# Patient Record
Sex: Female | Born: 1970
Health system: Southern US, Community
[De-identification: ages and names within clinical notes are randomized; demographics above are authoritative.]

## PROBLEM LIST (undated history)

## (undated) DIAGNOSIS — F419 Anxiety disorder, unspecified: Secondary | ICD-10-CM

## (undated) DIAGNOSIS — E079 Disorder of thyroid, unspecified: Secondary | ICD-10-CM

## (undated) DIAGNOSIS — T7840XA Allergy, unspecified, initial encounter: Secondary | ICD-10-CM

## (undated) DIAGNOSIS — R011 Cardiac murmur, unspecified: Secondary | ICD-10-CM

## (undated) DIAGNOSIS — I10 Essential (primary) hypertension: Secondary | ICD-10-CM

## (undated) DIAGNOSIS — F32A Depression, unspecified: Secondary | ICD-10-CM

## (undated) DIAGNOSIS — I471 Supraventricular tachycardia, unspecified: Secondary | ICD-10-CM

## (undated) DIAGNOSIS — M1990 Unspecified osteoarthritis, unspecified site: Secondary | ICD-10-CM

## (undated) HISTORY — DX: Unspecified osteoarthritis, unspecified site: M19.90

## (undated) HISTORY — DX: Disorder of thyroid, unspecified: E07.9

## (undated) HISTORY — DX: Allergy, unspecified, initial encounter: T78.40XA

## (undated) HISTORY — DX: Depression, unspecified: F32.A

## (undated) HISTORY — DX: Cardiac murmur, unspecified: R01.1

## (undated) HISTORY — DX: Anxiety disorder, unspecified: F41.9

## (undated) HISTORY — PX: CERVICAL DISCECTOMY: SHX98

## (undated) HISTORY — PX: OTHER SURGICAL HISTORY: SHX169

---

## 2003-06-13 ENCOUNTER — Other Ambulatory Visit: Admission: RE | Admit: 2003-06-13 | Discharge: 2003-06-13 | Payer: Self-pay | Admitting: Obstetrics and Gynecology

## 2003-11-14 ENCOUNTER — Emergency Department (HOSPITAL_COMMUNITY): Admission: EM | Admit: 2003-11-14 | Discharge: 2003-11-14 | Payer: Self-pay | Admitting: Family Medicine

## 2006-03-03 ENCOUNTER — Emergency Department (HOSPITAL_COMMUNITY): Admission: EM | Admit: 2006-03-03 | Discharge: 2006-03-03 | Payer: Self-pay | Admitting: Family Medicine

## 2008-08-09 ENCOUNTER — Emergency Department (HOSPITAL_COMMUNITY): Admission: EM | Admit: 2008-08-09 | Discharge: 2008-08-09 | Payer: Self-pay | Admitting: Family Medicine

## 2009-05-22 ENCOUNTER — Ambulatory Visit: Payer: Self-pay | Admitting: Family Medicine

## 2009-05-26 ENCOUNTER — Encounter: Admission: RE | Admit: 2009-05-26 | Discharge: 2009-05-26 | Payer: Self-pay | Admitting: Family Medicine

## 2009-08-10 ENCOUNTER — Encounter: Admission: RE | Admit: 2009-08-10 | Discharge: 2009-08-10 | Payer: Self-pay | Admitting: Neurosurgery

## 2009-08-20 ENCOUNTER — Encounter (HOSPITAL_COMMUNITY): Admission: RE | Admit: 2009-08-20 | Discharge: 2009-10-16 | Payer: Self-pay | Admitting: Neurosurgery

## 2012-03-07 ENCOUNTER — Encounter: Payer: Self-pay | Admitting: Family Medicine

## 2013-07-07 ENCOUNTER — Emergency Department (HOSPITAL_COMMUNITY)
Admission: EM | Admit: 2013-07-07 | Discharge: 2013-07-07 | Disposition: A | Discharge status: Home / Self Care | Payer: Managed Care, Other (non HMO) | Attending: Emergency Medicine | Admitting: Emergency Medicine

## 2013-07-07 ENCOUNTER — Emergency Department (HOSPITAL_COMMUNITY)
Admission: EM | Admit: 2013-07-07 | Discharge: 2013-07-07 | Disposition: A | Discharge status: Nursing Home (Includes ICF) | Payer: Managed Care, Other (non HMO) | Source: Home / Self Care | Attending: Family Medicine | Admitting: Family Medicine

## 2013-07-07 ENCOUNTER — Encounter (HOSPITAL_COMMUNITY): Payer: Self-pay | Admitting: Emergency Medicine

## 2013-07-07 ENCOUNTER — Emergency Department (HOSPITAL_COMMUNITY): Payer: Managed Care, Other (non HMO)

## 2013-07-07 DIAGNOSIS — M25559 Pain in unspecified hip: Secondary | ICD-10-CM | POA: Insufficient documentation

## 2013-07-07 DIAGNOSIS — M549 Dorsalgia, unspecified: Secondary | ICD-10-CM

## 2013-07-07 DIAGNOSIS — Z91040 Latex allergy status: Secondary | ICD-10-CM | POA: Insufficient documentation

## 2013-07-07 DIAGNOSIS — M25551 Pain in right hip: Secondary | ICD-10-CM | POA: Diagnosis present

## 2013-07-07 DIAGNOSIS — G8929 Other chronic pain: Secondary | ICD-10-CM | POA: Insufficient documentation

## 2013-07-07 LAB — POCT URINALYSIS DIP (DEVICE)
BILIRUBIN URINE: NEGATIVE
GLUCOSE, UA: NEGATIVE mg/dL
Hgb urine dipstick: NEGATIVE
Ketones, ur: NEGATIVE mg/dL
Leukocytes, UA: NEGATIVE
Nitrite: NEGATIVE
PROTEIN: NEGATIVE mg/dL
Specific Gravity, Urine: >=1.03 (ref 1.005–1.030)
UROBILINOGEN UA: 0.2 mg/dL (ref 0.0–1.0)
pH: 6 (ref 5.0–8.0)

## 2013-07-07 MED ORDER — OXYCODONE-ACETAMINOPHEN 5-325 MG PO TABS: 0.5000 | ORAL_TABLET | ORAL | Status: DC | PRN

## 2013-07-07 MED ORDER — CYCLOBENZAPRINE HCL 10 MG PO TABS: 5.0000 mg | ORAL_TABLET | Freq: Two times a day (BID) | ORAL | Status: DC | PRN

## 2013-07-07 MED ORDER — IBUPROFEN 800 MG PO TABS
800.0000 mg | ORAL_TABLET | Freq: Once | ORAL | Status: AC
  Administered 2013-07-07: 800 mg via ORAL
  Filled 2013-07-07: qty 1

## 2013-07-07 NOTE — ED Provider Notes (Signed)
CSN: [id number]     Arrival date & time 07/07/13  1811 History   First MD Initiated Contact with Patient 07/07/13 1856     Chief Complaint  Patient presents with  . Back Pain    Right side     (Consider location/radiation/quality/duration/timing/severity/associated sxs/prior Treatment) Patient is a 43 y.o. female presenting with back pain. The history is provided by the patient.  Back Pain Location:  Lumbar spine Quality:  Aching Radiates to:  Does not radiate Pain severity:  Moderate Pain is:  Same all the time Onset quality:  Gradual Timing:  Intermittent Progression since onset: acute worsening. Chronicity:  Chronic Context comment:  Worse after getting up this morning Relieved by: ibuprofen, rest. Worsened by:  Ambulation Ineffective treatments:  None tried Associated symptoms: no abdominal pain, no chest pain, no dysuria, no fever and no headaches     History reviewed. No pertinent past medical history. Past Surgical History  Procedure Laterality Date  . Cervical discectomy     No family history on file. History  Substance Use Topics  . Smoking status: Never Smoker   . Smokeless tobacco: Not on file  . Alcohol Use: No   OB History   Grav Para Term Preterm Abortions TAB SAB Ect Mult Living                 Review of Systems  Constitutional: Negative for fever and fatigue.  HENT: Negative for congestion and drooling.   Eyes: Negative for pain.  Respiratory: Negative for cough and shortness of breath.   Cardiovascular: Negative for chest pain.  Gastrointestinal: Negative for nausea, vomiting, abdominal pain and diarrhea.  Genitourinary: Negative for dysuria and hematuria.  Musculoskeletal: Positive for back pain. Negative for gait problem and neck pain.  Skin: Negative for color change.  Neurological: Negative for dizziness and headaches.  Hematological: Negative for adenopathy.  Psychiatric/Behavioral: Negative for behavioral problems.  All other systems  reviewed and are negative.      Allergies  Latex  Home Medications   Current Outpatient Rx  Name  Route  Sig  Dispense  Refill  . ibuprofen (ADVIL,MOTRIN) 800 MG tablet   Oral   Take 800 mg by mouth every 8 (eight) hours as needed for mild pain.           BP 126/92  Pulse 65  Temp(Src) 98.2 F (36.8 C) (Oral)  Resp 15  Ht 5\' 5"  (1.651 m)  Wt 225 lb (102.059 kg)  BMI 37.44 kg/m2  SpO2 100%  LMP 06/27/2013 Physical Exam  Nursing note and vitals reviewed. Constitutional: She is oriented to person, place, and time. She appears well-developed and well-nourished.  HENT:  Head: Normocephalic.  Mouth/Throat: Oropharynx is clear and moist. No oropharyngeal exudate.  Eyes: Conjunctivae and EOM are normal. Pupils are equal, round, and reactive to light.  Neck: Normal range of motion. Neck supple.  Cardiovascular: Normal rate, regular rhythm, normal heart sounds and intact distal pulses.  Exam reveals no gallop and no friction rub.   No murmur heard. Pulmonary/Chest: Effort normal and breath sounds normal. No respiratory distress. She has no wheezes.  Abdominal: Soft. Bowel sounds are normal. There is no tenderness. There is no rebound and no guarding.  Musculoskeletal: Normal range of motion. She exhibits no edema and no tenderness.  Mild midline lower lumbar tenderness to palpation.  Right upper gluteal tenderness to palpation.  Normal active and passive range of motion of right hip with mild reproduction of pain.  Back  pain is reproduced with rotation of the torso to the left and right.  2+ distal pulses in bilateral lower extremities.  Neurological: She is alert and oriented to person, place, and time.  Skin: Skin is warm and dry.  Psychiatric: She has a normal mood and affect. Her behavior is normal.    ED Course  Procedures (including critical care time) Labs Review Labs Reviewed - No data to display Imaging Review Dg Lumbar Spine Complete  07/07/2013   EXAM:  LUMBAR SPINE - COMPLETE 4+ VIEW  COMPARISON:  None.  FINDINGS: There is no evidence of lumbar spine fracture. Alignment is normal. Lumbar disc spaces are maintained. Mild to moderate degenerative disc disease seen at T12-L1. No evidence of facet DJD. No lytic or sclerotic bone lesions identified. Transitional lumbosacral vertebra noted at L5.  IMPRESSION: No acute findings.  T12-L1 degenerative disc disease.   Electronically Signed   By: Earle Gell M.D.   On: 07/07/2013 21:42   Dg Hip Complete Right  07/07/2013   CLINICAL DATA:  Chronic right-sided hip pain. Unable to bear weight on the right leg.  EXAM: RIGHT HIP - COMPLETE 2+ VIEW  COMPARISON:  No priors.  FINDINGS: AP view of the pelvis and AP and lateral views of the right hip demonstrate no acute displaced fracture of the bony pelvic ring or of the visualized right proximal femur. Right femoral head is properly located. Numerous pelvic phleboliths are incidentally noted.  IMPRESSION: 1. No acute radiographic abnormality of the bony pelvis or the right hip.   Electronically Signed   By: Vinnie Langton M.D.   On: 07/07/2013 21:42     EKG Interpretation None      MDM   Final diagnoses:  Back pain  Right hip pain    7:59 PM 43 y.o. female who presents with chronic low back pain which she has had since last summer. She denies any specific injury. She notes acute worsening this morning after getting out of bed. She states that her pain is so bad she is having difficulty ambulating. She also notes some mild right hip pain which is worse with ambulation. She's had this hip pain for several months. She denies any fevers, IV drug abuse, or other back pain red flags. She has focal tenderness of her midline lower lumbar area and right gluteal area. She is afebrile and vital signs are unremarkable here. Will get screening imaging. I offered narcotic medicines for pain control. She would prefer ibuprofen. Last menstrual period was 06/27/2013.  10:57  PM: I interpreted/reviewed the labs and/or imaging which were non-contributory.  Pt's pain improved w/ ibuprofen. Possible msk cause of her pain. Will provide stronger pain medicine and rec course of nsaids. Pt is ambulatory. I have discussed the diagnosis/risks/treatment options with the patient and believe the pt to be eligible for discharge home to follow-up with her pcp for continued pain. We also discussed returning to the ED immediately if new or worsening sx occur. We discussed the sx which are most concerning (e.g., worsening pain, fever, bp red flags discussed) that necessitate immediate return. Medications administered to the patient during their visit and any new prescriptions provided to the patient are listed below.  Medications given during this visit Medications  ibuprofen (ADVIL,MOTRIN) tablet 800 mg (800 mg Oral Given 07/07/13 2018)    Discharge Medication List as of 07/07/2013 10:59 PM    START taking these medications   Details  cyclobenzaprine (FLEXERIL) 10 MG tablet Take 0.5 tablets (5  mg total) by mouth 2 (two) times daily as needed for muscle spasms., Starting 07/07/2013, Until Discontinued, Print    oxyCODONE-acetaminophen (PERCOCET) 5-325 MG per tablet Take 0.5 tablets by mouth every 4 (four) hours as needed., Starting 07/07/2013, Until Discontinued, Print         Blanchard Kelch, MD 07/09/13 2033

## 2013-07-07 NOTE — ED Notes (Addendum)
Pt sent from urgent care for further eval of right sided low back pain that radiates around to hip area. Pt unsure when pain started. Pt reports fell recently some time while sking

## 2013-07-07 NOTE — ED Notes (Signed)
Pt reports lumbar pain radiating to groin x6 months. States "sometimes a 2/10 and sometimes a 10/10, but always there." Patient described pain as "bones grinding together." Patient reports pain started with last OBGYN exam approx 6 months ago. Diagnosed with fibroid cysts. NAD.

## 2013-07-07 NOTE — ED Provider Notes (Signed)
CSN: [id number]     Arrival date & time 07/07/13  1616 History   First MD Initiated Contact with Patient 07/07/13 1707     Chief Complaint  Patient presents with  . Back Pain   (Consider location/radiation/quality/duration/timing/severity/associated sxs/prior Treatment) Patient is a 43 y.o. female presenting with back pain. The history is provided by the patient and the spouse.  Back Pain Location:  Lumbar spine Quality:  Shooting Radiates to: right groin area. Pain severity:  Moderate (usually level 1-2 , now 9-10, requiring help with ambulation today.) Onset quality:  Gradual Duration:  6 months Progression:  Worsening Chronicity:  Chronic Context comment:  Uncertain etiol, has been seen by her gyn and lmd without relief of sx or dx. Worsened by:  Movement Associated symptoms: abdominal pain and pelvic pain   Associated symptoms: no abdominal swelling, no chest pain, no dysuria, no fever, no paresthesias and no weakness     History reviewed. No pertinent past medical history. History reviewed. No pertinent past surgical history. No family history on file. History  Substance Use Topics  . Smoking status: Never Smoker   . Smokeless tobacco: Not on file  . Alcohol Use: No   OB History   Grav Para Term Preterm Abortions TAB SAB Ect Mult Living                 Review of Systems  Constitutional: Negative.  Negative for fever.  Cardiovascular: Negative for chest pain.  Gastrointestinal: Positive for abdominal pain. Negative for nausea, vomiting and diarrhea.  Genitourinary: Positive for pelvic pain. Negative for dysuria, urgency and menstrual problem.  Musculoskeletal: Positive for back pain. Negative for joint swelling.  Skin: Negative.   Neurological: Negative for weakness and paresthesias.    Allergies  Latex  Home Medications   Current Outpatient Rx  Name  Route  Sig  Dispense  Refill  . ibuprofen (ADVIL,MOTRIN) 800 MG tablet   Oral   Take 800 mg by mouth every  8 (eight) hours as needed.          BP 139/95  Pulse 80  Temp(Src) 99.7 F (37.6 C) (Oral)  Resp 18  SpO2 98%  LMP 06/27/2013 Physical Exam  Nursing note and vitals reviewed. Constitutional: She is oriented to person, place, and time. She appears well-developed and well-nourished. She appears distressed.  Neck: Normal range of motion. Neck supple.  Pulmonary/Chest: Effort normal and breath sounds normal.  Abdominal: Soft. Bowel sounds are normal. She exhibits no distension and no mass. There is no splenomegaly or hepatomegaly. There is tenderness in the epigastric area. There is no rigidity, no rebound, no guarding and no CVA tenderness. No hernia. Hernia confirmed negative in the ventral area.  Musculoskeletal: She exhibits tenderness.       Back:  Neurological: She is alert and oriented to person, place, and time.  Skin: Skin is warm and dry.    ED Course  Procedures (including critical care time) Labs Review Labs Reviewed  POCT URINALYSIS DIP (DEVICE)   Imaging Review No results found. U/a neg.  MDM   1. Back pain with radiation    Sent for eval of likely lumbar disc pain, worsening today affecting ability to walk., present for 6 mos, getting worse.    Billy Fischer, MD 07/07/13 [id number]

## 2013-07-07 NOTE — ED Notes (Signed)
MD at bedside. 

## 2013-07-07 NOTE — ED Notes (Signed)
Patient complains of lower back pain; states pain has steadily gotten worse over the past 3 months to the point of having problems ambulating and moving.

## 2016-05-03 ENCOUNTER — Other Ambulatory Visit: Payer: Self-pay | Admitting: Family Medicine

## 2016-05-03 DIAGNOSIS — Z1231 Encounter for screening mammogram for malignant neoplasm of breast: Secondary | ICD-10-CM

## 2016-05-10 ENCOUNTER — Other Ambulatory Visit: Payer: Self-pay | Admitting: *Deleted

## 2016-05-10 ENCOUNTER — Inpatient Hospital Stay
Admission: RE | Admit: 2016-05-10 | Discharge: 2016-05-10 | Disposition: A | Discharge status: Home / Self Care | Payer: Self-pay | Source: Ambulatory Visit | Attending: *Deleted | Admitting: *Deleted

## 2016-05-10 DIAGNOSIS — Z9289 Personal history of other medical treatment: Secondary | ICD-10-CM

## 2016-06-29 ENCOUNTER — Ambulatory Visit (HOSPITAL_COMMUNITY)
Admission: EM | Admit: 2016-06-29 | Discharge: 2016-06-29 | Disposition: A | Discharge status: Nursing Home (Includes ICF) | Payer: Managed Care, Other (non HMO)

## 2016-06-29 ENCOUNTER — Emergency Department (HOSPITAL_COMMUNITY): Payer: BLUE CROSS/BLUE SHIELD

## 2016-06-29 ENCOUNTER — Emergency Department (HOSPITAL_COMMUNITY)
Admission: EM | Admit: 2016-06-29 | Discharge: 2016-06-29 | Disposition: A | Discharge status: Home / Self Care | Payer: Managed Care, Other (non HMO)

## 2016-06-29 ENCOUNTER — Encounter (HOSPITAL_COMMUNITY): Payer: Self-pay | Admitting: *Deleted

## 2016-06-29 ENCOUNTER — Emergency Department (HOSPITAL_COMMUNITY)
Admission: EM | Admit: 2016-06-29 | Discharge: 2016-06-29 | Disposition: A | Discharge status: Home / Self Care | Payer: BLUE CROSS/BLUE SHIELD | Attending: Emergency Medicine | Admitting: Emergency Medicine

## 2016-06-29 DIAGNOSIS — R002 Palpitations: Secondary | ICD-10-CM | POA: Diagnosis present

## 2016-06-29 DIAGNOSIS — Z91040 Latex allergy status: Secondary | ICD-10-CM | POA: Insufficient documentation

## 2016-06-29 DIAGNOSIS — I471 Supraventricular tachycardia: Secondary | ICD-10-CM | POA: Diagnosis not present

## 2016-06-29 HISTORY — DX: Supraventricular tachycardia, unspecified: I47.10

## 2016-06-29 HISTORY — DX: Supraventricular tachycardia: I47.1

## 2016-06-29 LAB — BASIC METABOLIC PANEL
ANION GAP: 10 (ref 5–15)
BUN: 12 mg/dL (ref 6–20)
CALCIUM: 9.4 mg/dL (ref 8.9–10.3)
CO2: 24 mmol/L (ref 22–32)
Chloride: 104 mmol/L (ref 101–111)
Creatinine, Ser: 0.96 mg/dL (ref 0.44–1.00)
GFR calc Af Amer: >60 mL/min (ref >60)
GLUCOSE: 128 mg/dL — AB (ref 65–99)
Potassium: 4.3 mmol/L (ref 3.5–5.1)
SODIUM: 138 mmol/L (ref 135–145)

## 2016-06-29 LAB — CBC
HCT: 44.5 % (ref 36.0–46.0)
HEMOGLOBIN: 14.9 g/dL (ref 12.0–15.0)
MCH: 30.7 pg (ref 26.0–34.0)
MCHC: 33.5 g/dL (ref 30.0–36.0)
MCV: 91.8 fL (ref 78.0–100.0)
Platelets: 289 10*3/uL (ref 150–400)
RBC: 4.85 MIL/uL (ref 3.87–5.11)
RDW: 13.1 % (ref 11.5–15.5)
WBC: 6.8 10*3/uL (ref 4.0–10.5)

## 2016-06-29 LAB — I-STAT TROPONIN, ED: TROPONIN I, POC: 0.02 ng/mL (ref 0.00–0.08)

## 2016-06-29 MED ORDER — METOPROLOL TARTRATE 50 MG PO TABS: 50.0000 mg | ORAL_TABLET | Freq: Two times a day (BID) | ORAL | 0 refills | Status: DC | PRN

## 2016-06-29 MED ORDER — METOPROLOL TARTRATE 5 MG/5ML IV SOLN
5.0000 mg | Freq: Once | INTRAVENOUS | Status: AC
  Administered 2016-06-29: 5 mg via INTRAVENOUS
  Filled 2016-06-29: qty 5

## 2016-06-29 NOTE — ED Provider Notes (Signed)
[facility] DEPT Provider Note   CSN: [id number] Arrival date & time: 06/29/16  1036     History   Chief Complaint Chief Complaint  Patient presents with  . Palpitations    HPI Elaine Mosley is a 46 y.o. female.  HPI Patient presents to the emergency department complaining of elevated heart rate.  Patient has a history of SVT.  She's not on any medications currently.  No chest pain or shortness of breath but does feel her heart racing.  This began today.  She presents the emergency department with a heart rate in the 170s and SVT.  No other complaints.  No recent illness.  2-3 caffeinated drinks per week but no more recently.  No other complaints   Past Medical History:  Diagnosis Date  . SVT (supraventricular tachycardia) [facility])     Patient Active Problem List   Diagnosis Date Noted  . Back pain 07/07/2013  . Right hip pain 07/07/2013    Past Surgical History:  Procedure Laterality Date  . CERVICAL DISCECTOMY      OB History    No data available       Home Medications    Prior to Admission medications   Medication Sig Start Date End Date Taking? Authorizing Provider  cyclobenzaprine (FLEXERIL) 10 MG tablet Take 0.5 tablets (5 mg total) by mouth 2 (two) times daily as needed for muscle spasms. 07/07/13   Pamella Pert, MD  ibuprofen (ADVIL,MOTRIN) 800 MG tablet Take 800 mg by mouth every 8 (eight) hours as needed for mild pain.     Historical Provider, MD  oxyCODONE-acetaminophen (PERCOCET) 5-325 MG per tablet Take 0.5 tablets by mouth every 4 (four) hours as needed. 07/07/13   Pamella Pert, MD    Family History No family history on file.  Social History Social History  Substance Use Topics  . Smoking status: Never Smoker  . Smokeless tobacco: Not on file  . Alcohol use No     Allergies   Latex   Review of Systems Review of Systems  All other systems reviewed and are negative.    Physical Exam Updated Vital Signs BP 139/58 (BP  Location: Left Arm)   Pulse (!) 176   Temp 97.7 F (36.5 C) (Oral)   Resp 20   LMP 06/22/2016   SpO2 98%   Physical Exam  Constitutional: She is oriented to person, place, and time. She appears well-developed and well-nourished. No distress.  HENT:  Head: Normocephalic and atraumatic.  Eyes: EOM are normal.  Neck: Normal range of motion.  Cardiovascular: Regular rhythm and normal heart sounds.   Tachycardia  Pulmonary/Chest: Effort normal and breath sounds normal.  Abdominal: Soft. She exhibits no distension. There is no tenderness.  Musculoskeletal: Normal range of motion.  Neurological: She is alert and oriented to person, place, and time.  Skin: Skin is warm and dry.  Psychiatric: She has a normal mood and affect. Judgment normal.  Nursing note and vitals reviewed.    ED Treatments / Results  Labs (all labs ordered are listed, but only abnormal results are displayed) Labs Reviewed  BASIC METABOLIC PANEL - Abnormal; Notable for the following:       Result Value   Glucose, Bld 128 (*)    All other components within normal limits  CBC  I-STAT TROPOININ, ED    EKG  EKG Interpretation  Date/Time:  Wednesday June 29 2016 10:40:26 EDT Ventricular Rate:  176 PR Interval:    QRS Duration: 64 QT Interval:  264 QTC Calculation: 451 R Axis:   54 Text Interpretation:  Supraventricular tachycardia Nonspecific ST and T wave abnormality Abnormal ECG No old tracing to compare Confirmed by Anniemae Haberkorn  MD, Lennette Bihari ([id number]) on 06/29/2016 12:28:33 PM       Radiology Dg Chest Portable 1 View  Result Date: 06/29/2016 CLINICAL DATA:  Cardiac palpitations EXAM: PORTABLE CHEST 1 VIEW COMPARISON:  None. FINDINGS: There is no edema or consolidation. Heart size and pulmonary vascularity are normal. No adenopathy. No bone lesions. IMPRESSION: No edema or consolidation. Electronically Signed   By: Lowella Grip III M.D.   On: 06/29/2016 11:21    Procedures Procedures (including critical  care time)  Medications Ordered in ED Medications  metoprolol (LOPRESSOR) injection 5 mg (5 mg Intravenous Given 06/29/16 1145)     Initial Impression / Assessment and Plan / ED Course  I have reviewed the triage vital signs and the nursing notes.  Pertinent labs & imaging results that were available during my care of the patient were reviewed by me and considered in my medical decision making (see chart for details).    After 5 mg IV Lopressor the patient converted to sinus rhythm.  Current heart rate is 93.  She'll be placed on when necessary metoprolol.  She's had a echocardiogram previously.  She likely does not need additional cardiac workup at unless this becomes  more recurrent.  Her last event was over a year ago  Final Clinical Impressions(s) / ED Diagnoses   Final diagnoses:  SVT (supraventricular tachycardia) ([place])    New Prescriptions New Prescriptions   No medications on file     Jola Schmidt, MD 06/29/16 1229

## 2016-06-29 NOTE — ED Notes (Signed)
Walked patient to the bathroom patient did great  

## 2016-06-29 NOTE — ED Notes (Signed)
Pt chose not to stay in ED and told registration she was leaving.

## 2016-06-29 NOTE — ED Triage Notes (Signed)
Pt reports onset this am of palpitations and felt like heart was racing. Hx of svt. HR 176 at triage.

## 2016-08-09 ENCOUNTER — Other Ambulatory Visit: Payer: Self-pay | Admitting: Obstetrics & Gynecology

## 2016-08-09 ENCOUNTER — Other Ambulatory Visit (HOSPITAL_COMMUNITY)
Admission: RE | Admit: 2016-08-09 | Discharge: 2016-08-09 | Disposition: A | Discharge status: Home / Self Care | Payer: BLUE CROSS/BLUE SHIELD | Source: Ambulatory Visit | Attending: Obstetrics & Gynecology | Admitting: Obstetrics & Gynecology

## 2016-08-09 DIAGNOSIS — Z01411 Encounter for gynecological examination (general) (routine) with abnormal findings: Secondary | ICD-10-CM | POA: Insufficient documentation

## 2016-08-09 DIAGNOSIS — Z1151 Encounter for screening for human papillomavirus (HPV): Secondary | ICD-10-CM | POA: Diagnosis not present

## 2016-08-10 ENCOUNTER — Other Ambulatory Visit: Payer: Self-pay | Admitting: Obstetrics & Gynecology

## 2016-08-10 DIAGNOSIS — E049 Nontoxic goiter, unspecified: Secondary | ICD-10-CM

## 2016-08-15 LAB — CYTOLOGY - PAP
Diagnosis: NEGATIVE
HPV: NOT DETECTED

## 2016-08-18 ENCOUNTER — Ambulatory Visit
Admission: RE | Admit: 2016-08-18 | Discharge: 2016-08-18 | Disposition: A | Discharge status: Home / Self Care | Payer: BLUE CROSS/BLUE SHIELD | Source: Ambulatory Visit | Attending: Obstetrics & Gynecology | Admitting: Obstetrics & Gynecology

## 2016-08-18 DIAGNOSIS — E049 Nontoxic goiter, unspecified: Secondary | ICD-10-CM

## 2017-06-18 ENCOUNTER — Other Ambulatory Visit: Payer: Self-pay

## 2017-06-18 ENCOUNTER — Encounter (HOSPITAL_COMMUNITY): Payer: Self-pay

## 2017-06-18 ENCOUNTER — Emergency Department (HOSPITAL_COMMUNITY)
Admission: EM | Admit: 2017-06-18 | Discharge: 2017-06-18 | Disposition: A | Discharge status: Home / Self Care | Payer: Managed Care, Other (non HMO) | Attending: Emergency Medicine | Admitting: Emergency Medicine

## 2017-06-18 DIAGNOSIS — Z79899 Other long term (current) drug therapy: Secondary | ICD-10-CM | POA: Insufficient documentation

## 2017-06-18 DIAGNOSIS — J029 Acute pharyngitis, unspecified: Secondary | ICD-10-CM | POA: Diagnosis not present

## 2017-06-18 DIAGNOSIS — R040 Epistaxis: Secondary | ICD-10-CM | POA: Diagnosis not present

## 2017-06-18 DIAGNOSIS — Z91040 Latex allergy status: Secondary | ICD-10-CM | POA: Diagnosis not present

## 2017-06-18 LAB — RAPID STREP SCREEN (MED CTR MEBANE ONLY): STREPTOCOCCUS, GROUP A SCREEN (DIRECT): NEGATIVE

## 2017-06-18 MED ORDER — HYDROCODONE-ACETAMINOPHEN 7.5-325 MG/15ML PO SOLN: 15.0000 mL | Freq: Three times a day (TID) | ORAL | 0 refills | Status: DC | PRN

## 2017-06-18 MED ORDER — OXYMETAZOLINE HCL 0.05 % NA SOLN
1.0000 | Freq: Once | NASAL | Status: DC
  Filled 2017-06-18: qty 15

## 2017-06-18 NOTE — ED Provider Notes (Signed)
[facility] EMERGENCY DEPARTMENT Provider Note   CSN: [id number] Arrival date & time: 06/18/17  4098     History   Chief Complaint No chief complaint on file.   HPI Elaine Mosley is a 47 y.o. female.  The history is provided by the patient and medical records.    47 y.o. F with hx of SVT, presenting to the ED for nosebleed.  States this began around 0445 this morning.  Bleeding only from left nostril.  This all began while she was coughing (reports she has the flu).  Not currently on anti-coagulation. No hx of bleeding disorder.  She has been applying pressure and ice without cessation of bleeding.  States he also feels like there is something "stuck" in her throat.  States it feels very swollen and sore, especially with swallowing.  Is able to eat/drink but with some pain.  Past Medical History:  Diagnosis Date  . SVT (supraventricular tachycardia) [facility])     Patient Active Problem List   Diagnosis Date Noted  . Back pain 07/07/2013  . Right hip pain 07/07/2013    Past Surgical History:  Procedure Laterality Date  . CERVICAL DISCECTOMY      OB History    No data available       Home Medications    Prior to Admission medications   Medication Sig Start Date End Date Taking? Authorizing Provider  cyclobenzaprine (FLEXERIL) 10 MG tablet Take 0.5 tablets (5 mg total) by mouth 2 (two) times daily as needed for muscle spasms. 07/07/13   Pamella Pert, MD  ibuprofen (ADVIL,MOTRIN) 800 MG tablet Take 800 mg by mouth every 8 (eight) hours as needed for mild pain.     [provider]  metoprolol (LOPRESSOR) 50 MG tablet Take 1 tablet (50 mg total) by mouth every 12 (twelve) hours as needed (rapid heart rate). 06/29/16   Jola Schmidt, MD  oxyCODONE-acetaminophen (PERCOCET) 5-325 MG per tablet Take 0.5 tablets by mouth every 4 (four) hours as needed. 07/07/13   Pamella Pert, MD    Family History History reviewed. No pertinent family  history.  Social History Social History   Tobacco Use  . Smoking status: Never Smoker  Substance Use Topics  . Alcohol use: No  . Drug use: No     Allergies   Latex   Review of Systems Review of Systems  HENT: Positive for nosebleeds and sore throat.   All other systems reviewed and are negative.    Physical Exam Updated Vital Signs BP (!) 153/116   Pulse 89   Ht 5\' 5"  (1.651 m)   Wt 105.2 kg (232 lb)   SpO2 95%   BMI 38.61 kg/m   Physical Exam  Constitutional: She is oriented to person, place, and time. She appears well-developed and well-nourished.  HENT:  Head: Normocephalic and atraumatic.  Mouth/Throat: Oropharynx is clear and moist.  Small area of bleeding from left septal wall; no septal deformity or hematoma; no external signs of trauma to the face Tonsils 1+ bilaterally without exudate; uvula midline without evidence of peritonsillar abscess; handling secretions appropriately; no difficulty swallowing or speaking; normal phonation without stridor; no blood in posterior oropharynx  Eyes: Conjunctivae and EOM are normal. Pupils are equal, round, and reactive to light.  Neck: Normal range of motion.  Cardiovascular: Normal rate, regular rhythm and normal heart sounds.  Pulmonary/Chest: Effort normal and breath sounds normal. No stridor. No respiratory distress.  Abdominal: Soft. Bowel sounds are normal. There is  no tenderness. There is no rebound.  Musculoskeletal: Normal range of motion.  Neurological: She is alert and oriented to person, place, and time.  Skin: Skin is warm and dry.  Psychiatric: She has a normal mood and affect.  Nursing note and vitals reviewed.    ED Treatments / Results  Labs (all labs ordered are listed, but only abnormal results are displayed) Labs Reviewed  RAPID STREP SCREEN (NOT AT [facility])  CULTURE, GROUP A STREP [facility])    EKG  EKG Interpretation None       Radiology No results found.  Procedures Procedures  (including critical care time)  Medications Ordered in ED Medications  oxymetazoline (AFRIN) 0.05 % nasal spray 1 spray (not administered)     Initial Impression / Assessment and Plan / ED Course  I have reviewed the triage vital signs and the nursing notes.  Pertinent labs & imaging results that were available during my care of the patient were reviewed by me and considered in my medical decision making (see chart for details).  47 year old female here with nosebleed from left nostril that started approximately 1 hour prior to arrival.  On arrival she has some slight bleeding from the left nostril, appears to be from along the septal wall.  There is no septal deformity or hematoma noted.  No external signs of trauma.  Ice and direct pressure were applied and bleeding slowly stopped.  Patient also complaining of sore throat, recent diagnosis of flu.  Does have some mild tonsillar edema on exam but no exudates.  She is handling secretions well.  Rapid strep will be sent.  Will monitor for any recurrent bleeding.  6:09 AM Patient remains without any recurrent bleeding here.  Rapid strep is negative, culture pending.  Suspect this is sequela of flulike illness.  We will plan to discharge home with symptomatic care.  She was given Afrin from ED and instructed on home use, specifically not to use for more than 3 consecutive days.  She has prescription for Flonase from her primary care doctor that she can fill as well.  She was advised she will be contacted if her strep culture results are positive.  Otherwise rest, oral hydration.  Close PCP follow-up.  Discussed plan with patient, she acknowledged understanding and agreed with plan of care.  Return precautions given for new or worsening symptoms.  Final Clinical Impressions(s) / ED Diagnoses   Final diagnoses:  Nosebleed  Sore throat    ED Discharge Orders        Ordered    HYDROcodone-acetaminophen (HYCET) 7.5-325 mg/15 ml solution  Every 8  hours PRN     06/18/17 0614       Larene Pickett, PA-C 06/18/17 0617    Ward, Delice Bison, DO 06/18/17 [phone]

## 2017-06-18 NOTE — ED Triage Notes (Addendum)
Pt. From home . Pt states that her nose started to bleed an hour ago. Pt. Denies hx of nose bleeds. Pt. Is not on blood thinners. Pt. Also reports sore throat while swallowing.

## 2017-06-18 NOTE — Discharge Instructions (Signed)
Take the prescribed medication as directed for sore throat.  We will call you if throat culture is positive. Would recommend to use the flonase spray from your doctor.  If you need to use Afrin for recurrent bleed: 1.  Blow your nose completely. 2.  2 sprays into the nostril that is bleeding. 3. Hold pressure for about 5-10 mins.  Do not use afrin for more than 3 days in a row. Follow-up with your primary care doctor Return to the ED for new or worsening symptoms.

## 2017-06-20 LAB — CULTURE, GROUP A STREP (THRC)

## 2018-10-18 ENCOUNTER — Other Ambulatory Visit (HOSPITAL_COMMUNITY): Payer: Self-pay | Admitting: Family Medicine

## 2018-10-18 DIAGNOSIS — R229 Localized swelling, mass and lump, unspecified: Secondary | ICD-10-CM

## 2018-11-22 ENCOUNTER — Encounter (HOSPITAL_BASED_OUTPATIENT_CLINIC_OR_DEPARTMENT_OTHER): Payer: Self-pay | Admitting: Emergency Medicine

## 2018-11-22 ENCOUNTER — Other Ambulatory Visit: Payer: Self-pay

## 2018-11-22 ENCOUNTER — Emergency Department (HOSPITAL_BASED_OUTPATIENT_CLINIC_OR_DEPARTMENT_OTHER)
Admission: EM | Admit: 2018-11-22 | Discharge: 2018-11-23 | Disposition: A | Discharge status: Home / Self Care | Payer: Managed Care, Other (non HMO) | Attending: Emergency Medicine | Admitting: Emergency Medicine

## 2018-11-22 DIAGNOSIS — Z79899 Other long term (current) drug therapy: Secondary | ICD-10-CM | POA: Insufficient documentation

## 2018-11-22 DIAGNOSIS — Z91040 Latex allergy status: Secondary | ICD-10-CM | POA: Diagnosis not present

## 2018-11-22 DIAGNOSIS — R002 Palpitations: Secondary | ICD-10-CM | POA: Insufficient documentation

## 2018-11-22 LAB — CBC WITH DIFFERENTIAL/PLATELET
Abs Immature Granulocytes: 0.03 10*3/uL (ref 0.00–0.07)
Basophils Absolute: 0 10*3/uL (ref 0.0–0.1)
Basophils Relative: 1 %
Eosinophils Absolute: 0 10*3/uL (ref 0.0–0.5)
Eosinophils Relative: 0 %
HCT: 45.3 % (ref 36.0–46.0)
Hemoglobin: 14.7 g/dL (ref 12.0–15.0)
Immature Granulocytes: 0 %
Lymphocytes Relative: 36 %
Lymphs Abs: 2.8 10*3/uL (ref 0.7–4.0)
MCH: 29.9 pg (ref 26.0–34.0)
MCHC: 32.5 g/dL (ref 30.0–36.0)
MCV: 92.3 fL (ref 80.0–100.0)
Monocytes Absolute: 0.6 10*3/uL (ref 0.1–1.0)
Monocytes Relative: 7 %
Neutro Abs: 4.2 10*3/uL (ref 1.7–7.7)
Neutrophils Relative %: 56 %
Platelets: 250 10*3/uL (ref 150–400)
RBC: 4.91 MIL/uL (ref 3.87–5.11)
RDW: 12.4 % (ref 11.5–15.5)
WBC: 7.7 10*3/uL (ref 4.0–10.5)
nRBC: 0 % (ref 0.0–0.2)

## 2018-11-22 NOTE — ED Provider Notes (Signed)
[facility] Provider Note: Georgena Spurling, MD, FACEP  CSN: [id number] MRN: [id number] ARRIVAL: 11/22/18 at 2320 ROOM: [place]  Palpitations   HISTORY OF PRESENT ILLNESS  52/77/82 42:35 PM Elaine Mosley is a 48 y.o. female with a history of SVT who had a biopsy of her thyroid for suspicious nodule earlier today.  She states her blood pressure was elevated at that visit, 162/104.  She is here with palpitations that began at 10:43 PM.  She describes them as rapid and a feeling like something in her throat was being pulled down.  She denies associated chest pain, shortness of breath, lightheadedness, diaphoresis or nausea.  She took 50 mg of Lopressor but still does not feel "right".  Her heart rate on arrival was noted to be 98 and normal sinus rhythm.    Past Medical History:  Diagnosis Date  . SVT (supraventricular tachycardia) (HCC)     Past Surgical History:  Procedure Laterality Date  . CERVICAL DISCECTOMY      No family history on file.  Social History   Tobacco Use  . Smoking status: Never Smoker  Substance Use Topics  . Alcohol use: No  . Drug use: No    Prior to Admission medications   Medication Sig Start Date End Date Taking? Authorizing Provider  cyclobenzaprine (FLEXERIL) 10 MG tablet Take 0.5 tablets (5 mg total) by mouth 2 (two) times daily as needed for muscle spasms. 07/07/13   Pamella Pert, MD  HYDROcodone-acetaminophen (HYCET) 7.5-325 mg/15 ml solution Take 15 mLs by mouth every 8 (eight) hours as needed for moderate pain. 06/18/17   Larene Pickett, PA-C  ibuprofen (ADVIL,MOTRIN) 800 MG tablet Take 800 mg by mouth every 8 (eight) hours as needed for mild pain.     [provider]  metoprolol (LOPRESSOR) 50 MG tablet Take 1 tablet (50 mg total) by mouth every 12 (twelve) hours as needed (rapid heart rate). 06/29/16   Jola Schmidt, MD  oxyCODONE-acetaminophen (PERCOCET) 5-325 MG per tablet Take 0.5 tablets by mouth  every 4 (four) hours as needed. 07/07/13   Pamella Pert, MD    Allergies Latex   REVIEW OF SYSTEMS  Negative except as noted here or in the History of Present Illness.   PHYSICAL EXAMINATION  Initial Vital Signs Blood pressure (!) 149/114, pulse 98, temperature 97.9 F (36.6 C), temperature source Oral, resp. rate 18, height 5\' 5"  (1.651 m), weight 99.8 kg, SpO2 99 %.  Examination General: Well-developed, well-nourished female in no acute distress; appearance consistent with age of record HENT: normocephalic; atraumatic Eyes: pupils equal, round and reactive to light; extraocular muscles intact Neck: supple Heart: regular rate and rhythm; no murmur Lungs: clear to auscultation bilaterally Abdomen: soft; nondistended; nontender; bowel sounds present Extremities: No deformity; full range of motion; pulses normal Neurologic: Awake, alert and oriented; motor function intact in all extremities and symmetric; no facial droop Skin: Warm and dry Psychiatric: Normal mood and affect   RESULTS  Summary of this visit's results, reviewed by myself:   EKG Interpretation  Date/Time:  Thursday November 22 2018 23:29:30 EDT Ventricular Rate:  99 PR Interval:    QRS Duration: 78 QT Interval:  348 QTC Calculation: 447 R Axis:   45 Text Interpretation:  Sinus rhythm Probable left atrial enlargement Probable left ventricular hypertrophy Borderline T abnormalities, inferior leads Baseline wander in lead(s) II III aVL aVF V1 V3 V5 Previously SVT Confirmed by Walterine Amodei [phone]) on 11/22/2018 11:34:42 PM  Laboratory Studies: Results for orders placed or performed during the hospital encounter of 11/22/18 (from the past 24 hour(s))  CBC with Differential/Platelet     Status: None   Collection Time: 11/22/18 11:43 PM  Result Value Ref Range   WBC 7.7 4.0 - 10.5 K/uL   RBC 4.91 3.87 - 5.11 MIL/uL   Hemoglobin 14.7 12.0 - 15.0 g/dL   HCT 45.3 36.0 - 46.0 %   MCV 92.3 80.0 - 100.0 fL    MCH 29.9 26.0 - 34.0 pg   MCHC 32.5 30.0 - 36.0 g/dL   RDW 12.4 11.5 - 15.5 %   Platelets 250 150 - 400 K/uL   nRBC 0.0 0.0 - 0.2 %   Neutrophils Relative % 56 %   Neutro Abs 4.2 1.7 - 7.7 K/uL   Lymphocytes Relative 36 %   Lymphs Abs 2.8 0.7 - 4.0 K/uL   Monocytes Relative 7 %   Monocytes Absolute 0.6 0.1 - 1.0 K/uL   Eosinophils Relative 0 %   Eosinophils Absolute 0.0 0.0 - 0.5 K/uL   Basophils Relative 1 %   Basophils Absolute 0.0 0.0 - 0.1 K/uL   Immature Granulocytes 0 %   Abs Immature Granulocytes 0.03 0.00 - 0.07 K/uL  Basic metabolic panel     Status: Abnormal   Collection Time: 11/22/18 11:43 PM  Result Value Ref Range   Sodium 138 135 - 145 mmol/L   Potassium 4.1 3.5 - 5.1 mmol/L   Chloride 100 98 - 111 mmol/L   CO2 25 22 - 32 mmol/L   Glucose, Bld 104 (H) 70 - 99 mg/dL   BUN 13 6 - 20 mg/dL   Creatinine, Ser 1.05 (H) 0.44 - 1.00 mg/dL   Calcium 9.5 8.9 - 10.3 mg/dL   GFR calc non Af Amer >60 >60 mL/min   GFR calc Af Amer >60 >60 mL/min   Anion gap 13 5 - 15   Imaging Studies: No results found.  ED COURSE and MDM  Nursing notes and initial vitals signs, including pulse oximetry, reviewed.  Vitals:   11/22/18 2342 11/23/18 0000 11/23/18 0030 11/23/18 0100  BP: (!) 156/114 (!) 150/102 (!) 156/104 (!) 136/107  Pulse: 94 100 94 88  Resp: 16 13 11 16   Temp:      TempSrc:      SpO2: 100% 93% 93% 93%  Weight:      Height:       1:17 AM Patient has been observed in the ED for 2 hours without arrhythmia seen on monitor.  Patient states she feels better and is ready to go home.  She was advised of her elevated blood pressure but as noted above this is already being followed as an outpatient.  PROCEDURES    ED DIAGNOSES     ICD-10-CM   1. Palpitations  R00.2        Aalani Aikens, Jenny Reichmann, MD 11/23/18 [id number]

## 2018-11-22 NOTE — ED Triage Notes (Signed)
Pt states that she felt palpitations around 2243. Took lopressor 50 aroudn 2247. Still doesn't feel right. Had some SHOB when the episode started. None currently.

## 2018-11-23 LAB — BASIC METABOLIC PANEL
Anion gap: 13 (ref 5–15)
BUN: 13 mg/dL (ref 6–20)
CO2: 25 mmol/L (ref 22–32)
Calcium: 9.5 mg/dL (ref 8.9–10.3)
Chloride: 100 mmol/L (ref 98–111)
Creatinine, Ser: 1.05 mg/dL — ABNORMAL HIGH (ref 0.44–1.00)
GFR calc Af Amer: >60 mL/min (ref >60)
GFR calc non Af Amer: >60 mL/min (ref >60)
Glucose, Bld: 104 mg/dL — ABNORMAL HIGH (ref 70–99)
Potassium: 4.1 mmol/L (ref 3.5–5.1)
Sodium: 138 mmol/L (ref 135–145)

## 2018-11-23 NOTE — ED Notes (Signed)
Pt understood dc material. NAD noted. All questions answered to satisfaction. Pt escorted to check out counter.

## 2018-12-18 HISTORY — PX: OTHER SURGICAL HISTORY: SHX169

## 2019-01-19 ENCOUNTER — Emergency Department (HOSPITAL_BASED_OUTPATIENT_CLINIC_OR_DEPARTMENT_OTHER)
Admission: EM | Admit: 2019-01-19 | Discharge: 2019-01-19 | Disposition: A | Discharge status: Home / Self Care | Payer: Managed Care, Other (non HMO) | Attending: Emergency Medicine | Admitting: Emergency Medicine

## 2019-01-19 ENCOUNTER — Encounter (HOSPITAL_BASED_OUTPATIENT_CLINIC_OR_DEPARTMENT_OTHER): Payer: Self-pay | Admitting: Emergency Medicine

## 2019-01-19 ENCOUNTER — Other Ambulatory Visit: Payer: Self-pay

## 2019-01-19 DIAGNOSIS — R002 Palpitations: Secondary | ICD-10-CM | POA: Diagnosis present

## 2019-01-19 DIAGNOSIS — I471 Supraventricular tachycardia: Secondary | ICD-10-CM | POA: Diagnosis not present

## 2019-01-19 DIAGNOSIS — Z91040 Latex allergy status: Secondary | ICD-10-CM | POA: Insufficient documentation

## 2019-01-19 DIAGNOSIS — I10 Essential (primary) hypertension: Secondary | ICD-10-CM | POA: Insufficient documentation

## 2019-01-19 HISTORY — DX: Essential (primary) hypertension: I10

## 2019-01-19 LAB — COMPREHENSIVE METABOLIC PANEL
ALT: 14 U/L (ref 0–44)
AST: 18 U/L (ref 15–41)
Albumin: 4 g/dL (ref 3.5–5.0)
Alkaline Phosphatase: 68 U/L (ref 38–126)
Anion gap: 12 (ref 5–15)
BUN: 13 mg/dL (ref 6–20)
CO2: 24 mmol/L (ref 22–32)
Calcium: 9.4 mg/dL (ref 8.9–10.3)
Chloride: 101 mmol/L (ref 98–111)
Creatinine, Ser: 1.06 mg/dL — ABNORMAL HIGH (ref 0.44–1.00)
GFR calc Af Amer: >60 mL/min (ref >60)
GFR calc non Af Amer: >60 mL/min (ref >60)
Glucose, Bld: 121 mg/dL — ABNORMAL HIGH (ref 70–99)
Potassium: 4 mmol/L (ref 3.5–5.1)
Sodium: 137 mmol/L (ref 135–145)
Total Bilirubin: 0.6 mg/dL (ref 0.3–1.2)
Total Protein: 7.8 g/dL (ref 6.5–8.1)

## 2019-01-19 LAB — URINALYSIS, ROUTINE W REFLEX MICROSCOPIC
Bilirubin Urine: NEGATIVE
Glucose, UA: NEGATIVE mg/dL
Hgb urine dipstick: NEGATIVE
Ketones, ur: NEGATIVE mg/dL
Leukocytes,Ua: NEGATIVE
Nitrite: NEGATIVE
Protein, ur: NEGATIVE mg/dL
Specific Gravity, Urine: <1.005 — ABNORMAL LOW (ref 1.005–1.030)
pH: 6 (ref 5.0–8.0)

## 2019-01-19 LAB — PREGNANCY, URINE: Preg Test, Ur: NEGATIVE

## 2019-01-19 LAB — CBC WITH DIFFERENTIAL/PLATELET
Abs Immature Granulocytes: 0.01 10*3/uL (ref 0.00–0.07)
Basophils Absolute: 0.1 10*3/uL (ref 0.0–0.1)
Basophils Relative: 1 %
Eosinophils Absolute: 0 10*3/uL (ref 0.0–0.5)
Eosinophils Relative: 1 %
HCT: 45.3 % (ref 36.0–46.0)
Hemoglobin: 14.8 g/dL (ref 12.0–15.0)
Immature Granulocytes: 0 %
Lymphocytes Relative: 45 %
Lymphs Abs: 3.6 10*3/uL (ref 0.7–4.0)
MCH: 30.5 pg (ref 26.0–34.0)
MCHC: 32.7 g/dL (ref 30.0–36.0)
MCV: 93.4 fL (ref 80.0–100.0)
Monocytes Absolute: 0.7 10*3/uL (ref 0.1–1.0)
Monocytes Relative: 8 %
Neutro Abs: 3.5 10*3/uL (ref 1.7–7.7)
Neutrophils Relative %: 45 %
Platelets: 270 10*3/uL (ref 150–400)
RBC: 4.85 MIL/uL (ref 3.87–5.11)
RDW: 13.5 % (ref 11.5–15.5)
WBC: 7.8 10*3/uL (ref 4.0–10.5)
nRBC: 0 % (ref 0.0–0.2)

## 2019-01-19 LAB — TSH: TSH: 6.241 u[IU]/mL — ABNORMAL HIGH (ref 0.350–4.500)

## 2019-01-19 LAB — TROPONIN I (HIGH SENSITIVITY): Troponin I (High Sensitivity): 10 ng/L (ref <18)

## 2019-01-19 LAB — MAGNESIUM: Magnesium: 1.9 mg/dL (ref 1.7–2.4)

## 2019-01-19 MED ORDER — SODIUM CHLORIDE 0.9 % IV BOLUS
1000.0000 mL | Freq: Once | INTRAVENOUS | Status: AC
  Administered 2019-01-19: 20:00:00 1000 mL via INTRAVENOUS

## 2019-01-19 MED ORDER — METOPROLOL TARTRATE 5 MG/5ML IV SOLN
2.5000 mg | Freq: Once | INTRAVENOUS | Status: AC
  Administered 2019-01-19: 2.5 mg via INTRAVENOUS
  Filled 2019-01-19: qty 5

## 2019-01-19 MED ORDER — ADENOSINE 6 MG/2ML IV SOLN
INTRAVENOUS | Status: AC
  Administered 2019-01-19: 19:00:00 6 mg
  Filled 2019-01-19: qty 2

## 2019-01-19 NOTE — ED Notes (Signed)
ED Provider at bedside. 

## 2019-01-19 NOTE — ED Provider Notes (Addendum)
[facility] Provider Note   CSN: [id number] Arrival date & time: 01/19/19  1901     History   Chief Complaint Chief Complaint  Patient presents with  . Tachycardia    HPI Elaine Mosley is a 48 y.o. female.     Pt presents to the ED today with tachycardia.  She has a hx of SVT and feels like she went into it around [place].  The pt took her metoprolol which she takes prn and it did not stop, so she came in today.     Past Medical History:  Diagnosis Date  . Hypertension   . SVT (supraventricular tachycardia) [facility])     Patient Active Problem List   Diagnosis Date Noted  . Back pain 07/07/2013  . Right hip pain 07/07/2013    Past Surgical History:  Procedure Laterality Date  . BTL    . CERVICAL DISCECTOMY    . 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      Home Medications    Prior to Admission medications   Medication Sig Start Date End Date Taking? Authorizing Provider  metoprolol (LOPRESSOR) 50 MG tablet Take 1 tablet (50 mg total) by mouth every 12 (twelve) hours as needed (rapid heart rate). 06/29/16  Denny Levy, MD    Family History No family history on file.  Social History Social History   Tobacco Use  . Smoking status: Never Smoker  . Smokeless tobacco: Never Used  Substance Use Topics  . Alcohol use: No  . Drug use: No     Allergies   Latex   Review of Systems Review of Systems  Cardiovascular: Positive for palpitations.  All other systems reviewed and are negative.    Physical Exam Updated Vital Signs BP (!) 128/96   Pulse 89   Resp 17   Ht 5' 5.5" (1.664 m)   Wt 98 kg   SpO2 100%   BMI 35.40 kg/m   Physical Exam Vitals signs and nursing note reviewed.  Constitutional:      Appearance: Normal appearance.  HENT:     Head: Normocephalic and atraumatic.     Right Ear: External ear normal.     Left Ear: External ear normal.     Nose:  Nose normal.     Mouth/Throat:     Mouth: Mucous membranes are moist.     Pharynx: Oropharynx is clear.  Eyes:     Extraocular Movements: Extraocular movements intact.     Conjunctiva/sclera: Conjunctivae normal.     Pupils: Pupils are equal, round, and reactive to light.  Neck:     Musculoskeletal: Normal range of motion and neck supple.  Cardiovascular:     Rate and Rhythm: Regular rhythm. Tachycardia present.     Pulses: Normal pulses.     Heart sounds: Normal heart sounds.  Pulmonary:     Effort: Pulmonary effort is normal.     Breath sounds: Normal breath sounds.  Abdominal:     General: Abdomen is flat. Bowel sounds are normal.     Palpations: Abdomen is soft.  Musculoskeletal: Normal range of motion.  Skin:    General: Skin is warm.     Capillary Refill: Capillary refill takes less than 2 seconds.  Neurological:     General: No focal deficit present.     Mental Status: She is alert and oriented to person, place, and time.  Psychiatric:  Mood and Affect: Mood normal.        Behavior: Behavior normal.        Thought Content: Thought content normal.      ED Treatments / Results  Labs (all labs ordered are listed, but only abnormal results are displayed) Labs Reviewed  COMPREHENSIVE METABOLIC PANEL - Abnormal; Notable for the following components:      Result Value   Glucose, Bld 121 (*)    Creatinine, Ser 1.06 (*)    All other components within normal limits  URINALYSIS, ROUTINE W REFLEX MICROSCOPIC - Abnormal; Notable for the following components:   Specific Gravity, Urine <1.005 (*)    All other components within normal limits  CBC WITH DIFFERENTIAL/PLATELET  MAGNESIUM  PREGNANCY, URINE  TSH  TROPONIN I (HIGH SENSITIVITY)    EKG EKG Interpretation  Date/Time:  Saturday January 19 2019 19:20:39 EDT Ventricular Rate:  105 PR Interval:    QRS Duration: 88 QT Interval:  354 QTC Calculation: 468 R Axis:   33 Text Interpretation:  Sinus  tachycardia Borderline T wave abnormalities after 6 mg adenosine IV Confirmed by Isla Pence [phone]) on 01/19/2019 8:11:47 PM   Radiology No results found.  Procedures .Cardioversion  Date/Time: 01/19/2019 7:20 PM Performed by: Isla Pence, MD Authorized by: Isla Pence, MD   Consent:    Consent obtained:  Verbal   Consent given by:  Patient   Risks discussed:  Induced arrhythmia   Alternatives discussed:  No treatment Pre-procedure details:    Rhythm:  Supraventricular tachycardia Attempt one:    Shock outcome:  Conversion to normal sinus rhythm Post-procedure details:    Patient status:  Awake   Patient tolerance of procedure:  Tolerated well, no immediate complications Comments:     Pt was given 6 mg of adenosine and SVT converted to NSR.     (including critical care time)  Medications Ordered in ED Medications  adenosine (ADENOCARD) 6 MG/2ML injection (6 mg  Given 01/19/19 1915)  sodium chloride 0.9 % bolus 1,000 mL (0 mLs Intravenous Stopped 01/19/19 2020)  metoprolol tartrate (LOPRESSOR) injection 2.5 mg (2.5 mg Intravenous Given 01/19/19 1938)     Initial Impression / Assessment and Plan / ED Course  I have reviewed the triage vital signs and the nursing notes.  Pertinent labs & imaging results that were available during my care of the patient were reviewed by me and considered in my medical decision making (see chart for details).    HR is down to 90 in NSR even after walking to the bathroom.  CRITICAL CARE Performed by: Isla Pence   Total critical care time: 30 minutes  Critical care time was exclusive of separately billable procedures and treating other patients.  Critical care was necessary to treat or prevent imminent or life-threatening deterioration.  Critical care was time spent personally by me on the following activities: development of treatment plan with patient and/or surrogate as well as nursing, discussions with consultants,  evaluation of patient's response to treatment, examination of patient, obtaining history from patient or surrogate, ordering and performing treatments and interventions, ordering and review of laboratory studies, ordering and review of radiographic studies, pulse oximetry and re-evaluation of patient's condition.  Pt is told to f/u with her cardiologist as scheduled.  Return if worse.  Final Clinical Impressions(s) / ED Diagnoses   Final diagnoses:  SVT (supraventricular tachycardia) [facility])    ED Discharge Orders    None       Isla Pence, MD 01/19/19  2044    Isla Pence, MD 02/02/19 1051

## 2019-01-19 NOTE — ED Triage Notes (Signed)
Pt has hx of SVT and takes metoprolol when needed. She had an episode begin tonight around 625pm and took a dose of metoprolol which has not yet helped. HR 180s during triage. Pt alert

## 2019-01-19 NOTE — ED Notes (Signed)
Adenosine 6mg  IV given per order.  Converted quickly.  Now in SR at 99.  To continue to monitor.

## 2020-04-18 HISTORY — PX: ELBOW SURGERY: SHX618

## 2020-04-23 ENCOUNTER — Other Ambulatory Visit: Payer: Self-pay | Admitting: Physician Assistant

## 2020-04-23 DIAGNOSIS — M5416 Radiculopathy, lumbar region: Secondary | ICD-10-CM

## 2020-04-26 ENCOUNTER — Other Ambulatory Visit: Payer: Managed Care, Other (non HMO)

## 2020-06-28 ENCOUNTER — Ambulatory Visit (INDEPENDENT_AMBULATORY_CARE_PROVIDER_SITE_OTHER): Payer: 59

## 2020-06-28 ENCOUNTER — Other Ambulatory Visit: Payer: Self-pay

## 2020-06-28 DIAGNOSIS — M545 Low back pain, unspecified: Secondary | ICD-10-CM

## 2020-06-28 DIAGNOSIS — M5416 Radiculopathy, lumbar region: Secondary | ICD-10-CM | POA: Diagnosis not present

## 2020-06-28 DIAGNOSIS — M25552 Pain in left hip: Secondary | ICD-10-CM

## 2020-07-06 ENCOUNTER — Other Ambulatory Visit: Payer: Self-pay

## 2020-07-06 ENCOUNTER — Other Ambulatory Visit (HOSPITAL_COMMUNITY): Payer: Self-pay | Admitting: Emergency Medicine

## 2020-07-06 ENCOUNTER — Emergency Department (HOSPITAL_BASED_OUTPATIENT_CLINIC_OR_DEPARTMENT_OTHER)
Admission: EM | Admit: 2020-07-06 | Discharge: 2020-07-06 | Disposition: A | Discharge status: Home / Self Care | Payer: 59 | Attending: Emergency Medicine | Admitting: Emergency Medicine

## 2020-07-06 ENCOUNTER — Encounter (HOSPITAL_BASED_OUTPATIENT_CLINIC_OR_DEPARTMENT_OTHER): Payer: Self-pay | Admitting: *Deleted

## 2020-07-06 DIAGNOSIS — I10 Essential (primary) hypertension: Secondary | ICD-10-CM | POA: Insufficient documentation

## 2020-07-06 DIAGNOSIS — R002 Palpitations: Secondary | ICD-10-CM | POA: Diagnosis present

## 2020-07-06 DIAGNOSIS — Z91040 Latex allergy status: Secondary | ICD-10-CM | POA: Insufficient documentation

## 2020-07-06 DIAGNOSIS — R42 Dizziness and giddiness: Secondary | ICD-10-CM | POA: Insufficient documentation

## 2020-07-06 DIAGNOSIS — I471 Supraventricular tachycardia, unspecified: Secondary | ICD-10-CM

## 2020-07-06 DIAGNOSIS — M542 Cervicalgia: Secondary | ICD-10-CM | POA: Diagnosis not present

## 2020-07-06 MED ORDER — POTASSIUM CHLORIDE ER 10 MEQ PO TBCR: 10.0000 meq | EXTENDED_RELEASE_TABLET | Freq: Every day | ORAL | 0 refills | Status: DC

## 2020-07-06 MED FILL — POTASSIUM CL ER 10 MEQ TAB: 10 | 14 days supply | Qty: 14 | Fill #0

## 2020-07-06 NOTE — ED Triage Notes (Signed)
30 minutes of SVT. She took her Rx medication with no relief. C/o headache.

## 2020-07-06 NOTE — Discharge Instructions (Addendum)
Please restart your home Losartan today, and both your Losartan and Metoprolol (50 mg) dose tomorrow morning.  Have your primary care doctor's office recheck your blood pressure and also check your potassium level this week.

## 2020-07-06 NOTE — ED Provider Notes (Signed)
[place] EMERGENCY DEPARTMENT Provider Note   CSN: [id number] Arrival date & time: 07/06/20  1252     History Chief Complaint  Patient presents with   Palpitations    Elaine Mosley is a 50 y.o. female w/ hx of HTN, SVT presenting to ED with palpitations.  Onset 12 pm today after a walk.  Palpitations, lightheaded, neck pain, felt HR was high.  Took metoprolol 100 mg, then Toprol 50 mg afterwards as an abortive.  On arrival in Ed HR was 195, after getting to her room her rate decreased to normal and she is now asymptomatic.  Hx of sporadic SVT in the past.  Her caridologist wanted her on Losartan 50 mg daily and metoprolol 50 mg daily for HTN and SVT prevention.  She has been intermittently noncompliant with her meds, not wanting to "be taking medications."    Does not drink excessive caffeine.  Did not eat or drink yet today.  No hx of low K or Mg.  Last check in Oct last year was normal for electrolytes.  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     No family history on file.  Social History   Tobacco Use   Smoking status: Never Smoker   Smokeless tobacco: Never Used  Substance Use Topics   Alcohol use: No   Drug use: No    Home Medications Prior to Admission medications   Medication Sig Start Date End Date Taking? Authorizing Provider  metoprolol (LOPRESSOR) 50 MG tablet Take 1 tablet (50 mg total) by mouth every 12 (twelve) hours as needed (rapid heart rate). 06/29/16  Yes Jola Schmidt, MD  potassium chloride (KLOR-CON) 10 MEQ tablet Take 1 tablet (10 mEq total) by mouth daily for 14  days. 07/06/20 07/20/20 Yes Chaka Boyson, Carola Rhine, MD    Allergies    Latex  Review of Systems   Review of Systems  Constitutional: Negative for chills and fever.  HENT: Negative for ear pain and sore throat.   Eyes: Negative for pain and visual disturbance.  Respiratory: Negative for cough and shortness of breath.   Cardiovascular: Positive for chest pain and palpitations.  Gastrointestinal: Negative for abdominal pain and vomiting.  Genitourinary: Negative for dysuria and hematuria.  Musculoskeletal: Negative for arthralgias and back pain.  Skin: Negative for color change and rash.  Neurological: Positive for light-headedness. Negative for seizures and syncope.  All other systems reviewed and are negative.   Physical Exam Updated Vital Signs BP (!) 144/110    Pulse 81    Temp 98.2 F (36.8 C) (Oral)    Resp 20    Ht 5' 5.75" (1.67 m)    Wt 102.5 kg    SpO2 99%    BMI 36.76 kg/m   Physical Exam Constitutional:      General: She is not in acute distress. HENT:     Head: Normocephalic and atraumatic.  Eyes:     Conjunctiva/sclera: Conjunctivae normal.     Pupils: Pupils are equal, round, and reactive to light.  Cardiovascular:     Rate and Rhythm: Normal rate and regular rhythm.  Pulmonary:     Effort: Pulmonary effort is normal. No respiratory distress.  Abdominal:     General: There is no distension.     Tenderness: There is no abdominal tenderness.  Skin:    General: Skin is warm and dry.  Neurological:     General: No focal deficit present.     Mental Status: She is alert. Mental status is at baseline.  Psychiatric:        Mood and Affect: Mood normal.        Behavior: Behavior normal.     ED Results / Procedures / Treatments   Labs (all labs ordered are listed, but only abnormal results are displayed) Labs Reviewed - No data to display  EKG EKG Interpretation  Date/Time:  Monday July 06 2020 13:08:55 EDT Ventricular Rate:  94 PR Interval:    QRS  Duration: 81 QT Interval:  359 QTC Calculation: 449 R Axis:   54 Text Interpretation: Sinus rhythm No STEMI Confirmed by Octaviano Glow [phone]) on 07/06/2020 1:52:02 PM   Radiology No results found.  Procedures Procedures   Medications Ordered in ED Medications - No data to display  ED Course  I have reviewed the triage vital signs and the nursing notes.  Pertinent labs & imaging results that were available during my care of the patient were reviewed by me and considered in my medical decision making (see chart for details).  Suspected spontaneous SVT at home today, resolved after coming to the ED.  HR normal on my exam, ecg reviewed and unremarkable per my interpretation  We discussed checking electrolyte levels as she's been fasting intermittently at home.  She does not want another IV stick for these.  I stated instead I will start her on potassium and advised her PCP check her levels in a week.  Pt also hypertensive today, reports poor compliance with BP meds.  I advised restarting her home losartan and also her home metoprolol 50 mg (tomorrow, since she took 150 mg today), which may also be preventative for her SVT episodes.  We discussed keeping a daily diary of her BP and bringing it to the doctors.    Labs reviewed from July 2021 - K 3.6, Cr 1.1.  She needs repeat annual screening labs.  Clinical Course as of 07/06/20 1725  Mon Jul 06, 2020  1317 HR 80, not 195 [MT]    Clinical Course User Index [MT] Fatimah Sundquist, Carola Rhine, MD    Final Clinical Impression(s) / ED Diagnoses Final diagnoses:  SVT (supraventricular tachycardia) ([place])    Rx / DC Orders ED Discharge Orders         Ordered    potassium chloride (KLOR-CON) 10 MEQ tablet  Daily        07/06/20 1413           Wyvonnia Dusky, MD 07/06/20 1728

## 2020-12-11 ENCOUNTER — Encounter: Payer: Self-pay | Admitting: Gastroenterology

## 2020-12-15 ENCOUNTER — Other Ambulatory Visit: Payer: Self-pay | Admitting: Nurse Practitioner

## 2020-12-15 DIAGNOSIS — D219 Benign neoplasm of connective and other soft tissue, unspecified: Secondary | ICD-10-CM

## 2020-12-16 ENCOUNTER — Other Ambulatory Visit: Payer: Self-pay | Admitting: Nurse Practitioner

## 2020-12-16 DIAGNOSIS — N921 Excessive and frequent menstruation with irregular cycle: Secondary | ICD-10-CM

## 2020-12-16 DIAGNOSIS — D219 Benign neoplasm of connective and other soft tissue, unspecified: Secondary | ICD-10-CM

## 2020-12-16 DIAGNOSIS — R109 Unspecified abdominal pain: Secondary | ICD-10-CM

## 2020-12-17 ENCOUNTER — Ambulatory Visit
Admission: RE | Admit: 2020-12-17 | Discharge: 2020-12-17 | Disposition: A | Discharge status: Home / Self Care | Payer: 59 | Source: Ambulatory Visit | Attending: Nurse Practitioner | Admitting: Nurse Practitioner

## 2020-12-17 DIAGNOSIS — D219 Benign neoplasm of connective and other soft tissue, unspecified: Secondary | ICD-10-CM

## 2020-12-17 DIAGNOSIS — N921 Excessive and frequent menstruation with irregular cycle: Secondary | ICD-10-CM

## 2020-12-17 DIAGNOSIS — R109 Unspecified abdominal pain: Secondary | ICD-10-CM

## 2020-12-23 ENCOUNTER — Other Ambulatory Visit: Payer: Self-pay

## 2020-12-23 ENCOUNTER — Ambulatory Visit (AMBULATORY_SURGERY_CENTER): Payer: 59 | Admitting: *Deleted

## 2020-12-23 VITALS — Ht 66.0 in | Wt 221.0 lb

## 2020-12-23 DIAGNOSIS — Z1211 Encounter for screening for malignant neoplasm of colon: Secondary | ICD-10-CM

## 2020-12-23 MED ORDER — PLENVU 140 G PO SOLR: 1.0000 | ORAL | 0 refills | Status: DC

## 2020-12-23 NOTE — Progress Notes (Signed)
No egg or soy allergy known to patient  No issues with past sedation with any surgeries or procedures Patient denies ever being told they had issues or difficulty with intubation  No FH of Malignant Hyperthermia No diet pills per patient No home 02 use per patient  No blood thinners per patient  Pt denies issues with constipation  No A fib or A flutter  EMMI video to pt or via [place] 19 guidelines implemented in [place] today with Pt and RN   Pt is fully vaccinated  for Covid   Plenvu  Coupon given to pt in PV today , Code to Pharmacy and  NO PA's for preps discussed with pt In PV today  Discussed with pt there will be an out-of-pocket cost for prep and that varies from $0 to 70 +  dollars   Due to the COVID-19 pandemic we are asking patients to follow certain guidelines.  Pt aware of COVID protocols and [facility] guidelines  Pt verified name, DOB, address and insurance during PV today.  Pt mailed instruction packet of Emmi video, copy of consent form to read and not return, and instructions. Plenvu  coupon mailed in packet. PV completed over the phone.  Pt encouraged to call with questions or issues.  My Chart instructions to pt as well

## 2020-12-28 ENCOUNTER — Encounter: Payer: Self-pay | Admitting: Gastroenterology

## 2021-01-06 ENCOUNTER — Encounter: Payer: Self-pay | Admitting: Gastroenterology

## 2021-01-06 ENCOUNTER — Other Ambulatory Visit: Payer: Self-pay

## 2021-01-06 ENCOUNTER — Ambulatory Visit (AMBULATORY_SURGERY_CENTER): Payer: 59 | Admitting: Gastroenterology

## 2021-01-06 VITALS — BP 130/74 | HR 72 | Resp 15 | Ht 66.0 in | Wt 224.0 lb

## 2021-01-06 DIAGNOSIS — Z1211 Encounter for screening for malignant neoplasm of colon: Secondary | ICD-10-CM | POA: Diagnosis not present

## 2021-01-06 DIAGNOSIS — D123 Benign neoplasm of transverse colon: Secondary | ICD-10-CM | POA: Diagnosis not present

## 2021-01-06 MED ORDER — SODIUM CHLORIDE 0.9 % IV SOLN
500.0000 mL | Freq: Once | INTRAVENOUS | Status: DC
Start: 2021-01-06 — End: 2021-01-06

## 2021-01-06 NOTE — Patient Instructions (Signed)
YOU HAD AN ENDOSCOPIC PROCEDURE TODAY AT THE [facility]:   Refer to the procedure report that was given to you for any specific questions about what was found during the examination.  If the procedure report does not answer your questions, please call your gastroenterologist to clarify.  If you requested that your care partner not be given the details of your procedure findings, then the procedure report has been included in a sealed envelope for you to review at your convenience later.  YOU SHOULD EXPECT: Some feelings of bloating in the abdomen. Passage of more gas than usual.  Walking can help get rid of the air that was put into your GI tract during the procedure and reduce the bloating. If you had a lower endoscopy (such as a colonoscopy or flexible sigmoidoscopy) you may notice spotting of blood in your stool or on the toilet paper. If you underwent a bowel prep for your procedure, you may not have a normal bowel movement for a few days.  Please Note:  You might notice some irritation and congestion in your nose or some drainage.  This is from the oxygen used during your procedure.  There is no need for concern and it should clear up in a day or so.  SYMPTOMS TO REPORT IMMEDIATELY:   Following lower endoscopy (colonoscopy or flexible sigmoidoscopy):  Excessive amounts of blood in the stool  Significant tenderness or worsening of abdominal pains  Swelling of the abdomen that is new, acute  Fever of 100F or higher   Following upper endoscopy (EGD)  Vomiting of blood or coffee ground material  New chest pain or pain under the shoulder blades  Painful or persistently difficult swallowing  New shortness of breath  Fever of 100F or higher  Black, tarry-looking stools  For urgent or emergent issues, a gastroenterologist can be reached at any hour by calling [phone]. Do not use MyChart messaging for urgent concerns.    DIET:  We do recommend a small meal at first, but  then you may proceed to your regular diet.  Drink plenty of fluids but you should avoid alcoholic beverages for 24 hours.  ACTIVITY:  You should plan to take it easy for the rest of today and you should NOT DRIVE or use heavy machinery until tomorrow (because of the sedation medicines used during the test).    FOLLOW UP: Our staff will call the number listed on your records 48-72 hours following your procedure to check on you and address any questions or concerns that you may have regarding the information given to you following your procedure. If we do not reach you, we will leave a message.  We will attempt to reach you two times.  During this call, we will ask if you have developed any symptoms of COVID 19. If you develop any symptoms (ie: fever, flu-like symptoms, shortness of breath, cough etc.) before then, please call [phone].  If you test positive for Covid 19 in the 2 weeks post procedure, please call and report this information to us.    If any biopsies were taken you will be contacted by phone or by letter within the next 1-3 weeks.  Please call us at [phone] if you have not heard about the biopsies in 3 weeks.    SIGNATURES/CONFIDENTIALITY: You and/or your care partner have signed paperwork which will be entered into your electronic medical record.  These signatures attest to the fact that that the information above on   your After Visit Summary has been reviewed and is understood.  Full responsibility of the confidentiality of this discharge information lies with you and/or your care-partner. 

## 2021-01-06 NOTE — Op Note (Signed)
[facility] Patient Name: Elaine Mosley Procedure Date: 01/06/2021 9:11 AM MRN: [id number] Endoscopist: Remo Lipps P. Havery Moros , MD Age: 50 Referring MD:  Date of Birth: Feb 20, 1971 Gender: Female Account #: [id number] Procedure:                Colonoscopy Indications:              Screening for colorectal malignant neoplasm - first                            time exam Medicines:                Monitored Anesthesia Care Procedure:                Pre-Anesthesia Assessment:                           - Prior to the procedure, a History and Physical                            was performed, and patient medications and                            allergies were reviewed. The patient's tolerance of                            previous anesthesia was also reviewed. The risks                            and benefits of the procedure and the sedation                            options and risks were discussed with the patient.                            All questions were answered, and informed consent                            was obtained. Prior Anticoagulants: The patient has                            taken no previous anticoagulant or antiplatelet                            agents. ASA Grade Assessment: II - A patient with                            mild systemic disease. After reviewing the risks                            and benefits, the patient was deemed in                            satisfactory condition to undergo the procedure.  After obtaining informed consent, the colonoscope                            was passed under direct vision. Throughout the                            procedure, the patient's blood pressure, pulse, and                            oxygen saturations were monitored continuously. The                            Olympus PCF-H190DL [id number]) Colonoscope was                            introduced through the anus and advanced to  the the                            cecum, identified by appendiceal orifice and                            ileocecal valve. The colonoscopy was performed                            without difficulty. The patient tolerated the                            procedure well. The quality of the bowel                            preparation was good. The ileocecal valve,                            appendiceal orifice, and rectum were photographed. Scope In: 9:16:42 AM Scope Out: 9:32:40 AM Scope Withdrawal Time: 0 hours 12 minutes 49 seconds  Total Procedure Duration: 0 hours 15 minutes 58 seconds  Findings:                 The perianal and digital rectal examinations were                            normal.                           A 3 mm polyp was found in the transverse colon. The                            polyp was sessile. The polyp was removed with a                            cold snare. Resection and retrieval were complete.                           A few small-mouthed diverticula were found in the  left colon.                           Internal hemorrhoids were found during                            retroflexion. The hemorrhoids were small.                           The exam was otherwise without abnormality. Complications:            No immediate complications. Estimated blood loss:                            Minimal. Estimated Blood Loss:     Estimated blood loss was minimal. Impression:               - One 3 mm polyp in the transverse colon, removed                            with a cold snare. Resected and retrieved.                           - Diverticulosis in the left colon.                           - Internal hemorrhoids.                           - The examination was otherwise normal. Recommendation:           - Patient has a contact number available for                            emergencies. The signs and symptoms of potential                             delayed complications were discussed with the                            patient. Return to normal activities tomorrow.                            Written discharge instructions were provided to the                            patient.                           - Resume previous diet.                           - Continue present medications.                           - Await pathology results. Remo Lipps P. Kartier Bennison, MD 01/06/2021 9:38:06 AM This report has been signed electronically.

## 2021-01-06 NOTE — Progress Notes (Signed)
Pt's states no medical or surgical changes since previsit or office visit.  ° °Vitals CW °

## 2021-01-06 NOTE — Progress Notes (Signed)
Sedate, gd SR, tolerated procedure well, VSS, report to RN 

## 2021-01-06 NOTE — Progress Notes (Signed)
[facility] History and Physical   Primary Care Physician:  Sharyne Peach, MD   Reason for Procedure:   Screening for colon cancer  Plan:    colonoscopy     HPI: Elaine Mosley is a 50 y.o. female  here for colonoscopy screening. Patient denies any bowel symptoms at this time other than occasional constipation. Grandfather had colon cancer, father had polyps. Otherwise feels well without any cardiopulmonary symptoms.    Past Medical History:  Diagnosis Date   Allergy    seasonal   Arthritis    knees and back   Heart murmur    Hypertension    SVT (supraventricular tachycardia) (HCC)    Thyroid disease     Past Surgical History:  Procedure Laterality Date   BTL     tubal ligation   CERVICAL DISCECTOMY Bilateral    ELBOW SURGERY Right 2022   right thyroid lobectomy  12/18/2018    Prior to Admission medications   Medication Sig Start Date End Date Taking? Authorizing Provider  EUTHYROX 50 MCG tablet Take 50 mcg by mouth every morning. 12/03/20  Yes [provider]  losartan (COZAAR) 100 MG tablet Take by mouth. 12/02/20 12/02/21 Yes [provider]  metoprolol succinate (TOPROL-XL) 100 MG 24 hr tablet Take 100 mg by mouth daily. 12/12/20  Yes [provider]  cetirizine (ZYRTEC) 10 MG tablet Take by mouth. Patient not taking: Reported on 01/06/2021    [provider]  gabapentin (NEURONTIN) 300 MG capsule Take 300 mg by mouth 3 (three) times daily. Patient not taking: Reported on 01/06/2021 10/15/20   [provider]    Current Outpatient Medications  Medication Sig Dispense Refill   EUTHYROX 50 MCG tablet Take 50 mcg by mouth every morning.     losartan (COZAAR) 100 MG tablet Take by mouth.     metoprolol succinate (TOPROL-XL) 100 MG 24 hr tablet Take 100 mg by mouth daily.     cetirizine (ZYRTEC) 10 MG tablet Take by mouth. (Patient not taking: Reported on 01/06/2021)     gabapentin (NEURONTIN) 300 MG capsule  Take 300 mg by mouth 3 (three) times daily. (Patient not taking: Reported on 01/06/2021)     Current Facility-Administered Medications  Medication Dose Route Frequency Provider Last Rate Last Admin   0.9 %  sodium chloride infusion  500 mL Intravenous Once Affan Callow, Carlota Raspberry, MD        Allergies as of 01/06/2021 - Review Complete 01/06/2021  Allergen Reaction Noted   Latex Itching and Rash 07/07/2013    Family History  Problem Relation Age of Onset   Colon polyps Father    Thyroid cancer Father    Colon polyps Paternal Grandmother    Colon cancer Paternal Grandmother    Esophageal cancer Neg Hx    Rectal cancer Neg Hx    Stomach cancer Neg Hx     Social History   Socioeconomic History   Marital status: Married    Spouse name: Not on file   Number of children: Not on file   Years of education: Not on file   Highest education level: Not on file  Occupational History   Not on file  Tobacco Use   Smoking status: Never   Smokeless tobacco: Never  Substance and Sexual Activity   Alcohol use: Yes    Comment: occ social   Drug use: No   Sexual activity: Not on file  Other Topics Concern   Not on file  Social  History Narrative   Not on file   Social Determinants of Health   Financial Resource Strain: Not on file  Food Insecurity: Not on file  Transportation Needs: Not on file  Physical Activity: Not on file  Stress: Not on file  Social Connections: Not on file  Intimate Partner Violence: Not on file    Review of Systems: All other review of systems negative except as mentioned in the HPI.  Physical Exam: Vital signs BP 131/83   Pulse 78   Ht 5\' 6"  (1.676 m)   Wt 224 lb (101.6 kg)   LMP 01/06/2021 Comment: Spotting  SpO2 99%   BMI 36.15 kg/m   General:   Alert,  Well-developed, well-nourished, pleasant and cooperative in NAD Lungs:  Clear throughout to auscultation.   Heart:  Regular rate and rhythm Abdomen:  Soft, nontender and nondistended.    Neuro/Psych:  Alert and cooperative. Normal mood and affect. A and O x 3  Jolly Mango, MD [facility]

## 2021-01-06 NOTE — Progress Notes (Signed)
Called to room to assist during endoscopic procedure.  Patient ID and intended procedure confirmed with present staff. Received instructions for my participation in the procedure from the performing physician.  

## 2021-01-08 ENCOUNTER — Telehealth: Payer: Self-pay

## 2021-01-08 NOTE — Telephone Encounter (Signed)
  Follow up Call-  Call back number 01/06/2021  Post procedure Call Back phone  # [phone]  Permission to leave phone message Yes  Some recent data might be hidden     Patient questions:  Do you have a fever, pain , or abdominal swelling? No. Pain Score  0 *  Have you tolerated food without any problems? Yes.    Have you been able to return to your normal activities? Yes.    Do you have any questions about your discharge instructions: Diet   No. Medications  No. Follow up visit  No.  Do you have questions or concerns about your Care? No.  Actions: * If pain score is 4 or above: No action needed, pain <4.

## 2021-02-20 ENCOUNTER — Emergency Department (HOSPITAL_BASED_OUTPATIENT_CLINIC_OR_DEPARTMENT_OTHER)
Admission: EM | Admit: 2021-02-20 | Discharge: 2021-02-20 | Disposition: A | Discharge status: Home / Self Care | Payer: 59 | Attending: Emergency Medicine | Admitting: Emergency Medicine

## 2021-02-20 ENCOUNTER — Other Ambulatory Visit: Payer: Self-pay

## 2021-02-20 DIAGNOSIS — Z91040 Latex allergy status: Secondary | ICD-10-CM | POA: Insufficient documentation

## 2021-02-20 DIAGNOSIS — R000 Tachycardia, unspecified: Secondary | ICD-10-CM | POA: Diagnosis present

## 2021-02-20 DIAGNOSIS — Z79899 Other long term (current) drug therapy: Secondary | ICD-10-CM | POA: Diagnosis not present

## 2021-02-20 DIAGNOSIS — I10 Essential (primary) hypertension: Secondary | ICD-10-CM | POA: Diagnosis not present

## 2021-02-20 DIAGNOSIS — R079 Chest pain, unspecified: Secondary | ICD-10-CM | POA: Insufficient documentation

## 2021-02-20 DIAGNOSIS — Y990 Civilian activity done for income or pay: Secondary | ICD-10-CM | POA: Insufficient documentation

## 2021-02-20 DIAGNOSIS — I471 Supraventricular tachycardia: Secondary | ICD-10-CM | POA: Diagnosis not present

## 2021-02-20 LAB — CBC WITH DIFFERENTIAL/PLATELET
Abs Immature Granulocytes: 0.01 10*3/uL (ref 0.00–0.07)
Basophils Absolute: 0.1 10*3/uL (ref 0.0–0.1)
Basophils Relative: 1 %
Eosinophils Absolute: 0 10*3/uL (ref 0.0–0.5)
Eosinophils Relative: 1 %
HCT: 45.4 % (ref 36.0–46.0)
Hemoglobin: 14.9 g/dL (ref 12.0–15.0)
Immature Granulocytes: 0 %
Lymphocytes Relative: 45 %
Lymphs Abs: 2.6 10*3/uL (ref 0.7–4.0)
MCH: 30.8 pg (ref 26.0–34.0)
MCHC: 32.8 g/dL (ref 30.0–36.0)
MCV: 94 fL (ref 80.0–100.0)
Monocytes Absolute: 0.4 10*3/uL (ref 0.1–1.0)
Monocytes Relative: 7 %
Neutro Abs: 2.6 10*3/uL (ref 1.7–7.7)
Neutrophils Relative %: 46 %
Platelets: 238 10*3/uL (ref 150–400)
RBC: 4.83 MIL/uL (ref 3.87–5.11)
RDW: 12.8 % (ref 11.5–15.5)
WBC: 5.7 10*3/uL (ref 4.0–10.5)
nRBC: 0 % (ref 0.0–0.2)

## 2021-02-20 LAB — BASIC METABOLIC PANEL
Anion gap: 9 (ref 5–15)
BUN: 18 mg/dL (ref 6–20)
CO2: 26 mmol/L (ref 22–32)
Calcium: 9 mg/dL (ref 8.9–10.3)
Chloride: 103 mmol/L (ref 98–111)
Creatinine, Ser: 1.1 mg/dL — ABNORMAL HIGH (ref 0.44–1.00)
GFR, Estimated: >60 mL/min (ref >60)
Glucose, Bld: 126 mg/dL — ABNORMAL HIGH (ref 70–99)
Potassium: 3.8 mmol/L (ref 3.5–5.1)
Sodium: 138 mmol/L (ref 135–145)

## 2021-02-20 LAB — HCG, SERUM, QUALITATIVE: Preg, Serum: NEGATIVE

## 2021-02-20 LAB — MAGNESIUM: Magnesium: 1.7 mg/dL (ref 1.7–2.4)

## 2021-02-20 MED ORDER — ADENOSINE 6 MG/2ML IV SOLN
6.0000 mg | Freq: Once | INTRAVENOUS | Status: AC
  Administered 2021-02-20: 6 mg via INTRAVENOUS

## 2021-02-20 NOTE — ED Provider Notes (Signed)
[facility] EMERGENCY DEPARTMENT Provider Note   CSN: [id number] Arrival date & time: 02/20/21  1100     History Chief Complaint  Patient presents with   Tachycardia    Elaine Mosley is a 50 y.o. female.  Presents to ER with concern for palpitations.  Patient states that this morning while at work she had sudden onset of palpitations, chest tightness.  Reports feels similar to prior episodes of SVT.  Took dose of metoprolol.  No change in symptoms.  Has not had episode of SVT in a while.  Has been evaluated by cardiology previously.  Denies any other complaints currently.  HPI     Past Medical History:  Diagnosis Date   Allergy    seasonal   Arthritis    knees and back   Heart murmur    Hypertension    SVT (supraventricular tachycardia) ([place])    Thyroid disease     Patient Active Problem List   Diagnosis Date Noted   Back pain 07/07/2013   Right hip pain 07/07/2013    Past Surgical History:  Procedure Laterality Date   BTL     tubal ligation   CERVICAL DISCECTOMY Bilateral    ELBOW SURGERY Right 2022   right thyroid lobectomy  12/18/2018     OB History   No obstetric history on file.     Family History  Problem Relation Age of Onset   Colon polyps Father    Thyroid cancer Father    Colon polyps Paternal Grandmother    Colon cancer Paternal Grandmother    Esophageal cancer Neg Hx    Rectal cancer Neg Hx    Stomach cancer Neg Hx     Social History   Tobacco Use   Smoking status: Never   Smokeless tobacco: Never  Substance Use Topics   Alcohol use: Yes    Comment: occ social   Drug use: No    Home Medications Prior to Admission medications   Medication Sig Start Date End Date Taking? Authorizing Provider  EUTHYROX 50 MCG tablet Take 50 mcg by mouth every morning. 12/03/20  Yes [provider]  ibuprofen (ADVIL) 400 MG tablet Take 400 mg by mouth every 6 (six) hours as needed.   Yes [provider]  losartan  (COZAAR) 100 MG tablet Take by mouth. 12/02/20 12/02/21 Yes [provider]  metoprolol succinate (TOPROL-XL) 100 MG 24 hr tablet Take 100 mg by mouth daily. 12/12/20  Yes [provider]  cetirizine (ZYRTEC) 10 MG tablet Take by mouth. Patient not taking: No sig reported    [provider]  gabapentin (NEURONTIN) 300 MG capsule Take 300 mg by mouth 3 (three) times daily. Patient not taking: No sig reported 10/15/20   [provider]    Allergies    Latex  Review of Systems   Review of Systems  Constitutional:  Negative for chills and fever.  HENT:  Negative for ear pain and sore throat.   Eyes:  Negative for pain and visual disturbance.  Respiratory:  Positive for chest tightness. Negative for cough and shortness of breath.   Cardiovascular:  Positive for palpitations. Negative for chest pain.  Gastrointestinal:  Negative for abdominal pain and vomiting.  Genitourinary:  Negative for dysuria and hematuria.  Musculoskeletal:  Negative for arthralgias and back pain.  Skin:  Negative for color change and rash.  Neurological:  Negative for seizures and syncope.  All other systems reviewed and are negative.  Physical Exam  Updated Vital Signs BP (!) 135/104   Pulse 86   Temp 97.6 F (36.4 C) (Oral)   Resp 18   SpO2 100%   Physical Exam Vitals and nursing note reviewed.  Constitutional:      General: She is not in acute distress.    Appearance: She is well-developed.  HENT:     Head: Normocephalic and atraumatic.  Eyes:     Conjunctiva/sclera: Conjunctivae normal.  Cardiovascular:     Rate and Rhythm: Regular rhythm. Tachycardia present.     Pulses: Normal pulses.  Pulmonary:     Effort: Pulmonary effort is normal. No respiratory distress.     Breath sounds: Normal breath sounds.  Abdominal:     Palpations: Abdomen is soft.     Tenderness: There is no abdominal tenderness.  Musculoskeletal:     Cervical back: Neck supple.  Skin:     General: Skin is warm and dry.  Neurological:     General: No focal deficit present.     Mental Status: She is alert.  Psychiatric:        Mood and Affect: Mood normal.    ED Results / Procedures / Treatments   Labs (all labs ordered are listed, but only abnormal results are displayed) Labs Reviewed  BASIC METABOLIC PANEL - Abnormal; Notable for the following components:      Result Value   Glucose, Bld 126 (*)    Creatinine, Ser 1.10 (*)    All other components within normal limits  CBC WITH DIFFERENTIAL/PLATELET  MAGNESIUM  HCG, SERUM, QUALITATIVE    EKG EKG Interpretation  Date/Time:  Saturday February 20 2021 11:34:45 EDT Ventricular Rate:  101 PR Interval:  179 QRS Duration: 83 QT Interval:  343 QTC Calculation: 445 R Axis:   49 Text Interpretation: Sinus tachycardia Borderline T wave abnormalities Baseline wander in lead(s) V6 Partial missing lead(s): V6 Confirmed by Madalyn Rob [phone]) on 02/20/2021 12:30:21 PM  Radiology No results found.  Procedures .Cardioversion  Date/Time: 02/21/2021 10:05 AM Performed by: Lucrezia Starch, MD Authorized by: Lucrezia Starch, MD   Consent:    Consent obtained:  Verbal   Consent given by:  Patient   Risks discussed:  Pain   Alternatives discussed:  No treatment, delayed treatment, rate-control medication, alternative treatment, observation and referral Pre-procedure details:    Cardioversion basis:  Emergent   Rhythm:  Supraventricular tachycardia (SVT) Patient sedated: No Attempt one:    Cardioversion mode attempt one: ADENOSINE.   Shock outcome:  Conversion to normal sinus rhythm Post-procedure details:    Patient status:  Awake   Patient tolerance of procedure:  Tolerated well, no immediate complications Comments:     Patient was defibrillator pads applied, ZOLL monitor turned on, timeout called, gave 6 mg of adenosine push, successful conversion from supraventricular tachycardia to normal sinus rhythm  witnessed on monitor; repeat twelve-lead confirmed, patient tolerated quite well    Medications Ordered in ED Medications  adenosine (ADENOCARD) 6 MG/2ML injection 6 mg (6 mg Intravenous Given 02/20/21 1132)    ED Course  I have reviewed the triage vital signs and the nursing notes.  Pertinent labs & imaging results that were available during my care of the patient were reviewed by me and considered in my medical decision making (see chart for details).    MDM Rules/Calculators/A&P                           50 year old  lady presents to ER with concern for palpitations, SVT.  On arrival patient noted to have heart rate in 170s.  Blood pressure stable.  EKG consistent with SVT.  Attempted vagal maneuver without success.  Proceeded with single dose of adenosine.  Patient promptly converted to normal sinus rhythm.  Tolerated well.  Observed in ER, basic labs stable.  No recurrence.  Recommend she follow-up with cardiology.  Had previously been followed by provider out of town.  Gave information and referral order for cardiology with the [facility] system.  After the discussed management above, the patient was determined to be safe for discharge.  The patient was in agreement with this plan and all questions regarding their care were answered.  ED return precautions were discussed and the patient will return to the ED with any significant worsening of condition.  Final Clinical Impression(s) / ED Diagnoses Final diagnoses:  SVT (supraventricular tachycardia) [facility])    Rx / DC Orders ED Discharge Orders          Ordered    Ambulatory referral to Cardiology        02/20/21 1324             Lucrezia Starch, MD 02/21/21 1008

## 2021-02-20 NOTE — ED Notes (Signed)
Placed on 2lpm [place] 

## 2021-02-20 NOTE — Discharge Instructions (Addendum)
Please follow-up with your cardiologist.  Discussed the episode today.  If you have recurrent palpitations, chest discomfort or other new concerning symptom, come back to ER for reassessment.  You may also contact our cardiology clinic if you able to follow-up with someone locally here in the [place] area.

## 2021-02-20 NOTE — ED Notes (Signed)
EDP at bedside for SVT - pt rate 174

## 2021-02-20 NOTE — ED Triage Notes (Signed)
Pt arrives pov with c/o chest tightness and palpitations that started this am while at work. Pt endorses shob

## 2021-03-31 ENCOUNTER — Ambulatory Visit (HOSPITAL_BASED_OUTPATIENT_CLINIC_OR_DEPARTMENT_OTHER): Payer: 59 | Admitting: Cardiology

## 2023-04-06 IMAGING — US US PELVIS COMPLETE WITH TRANSVAGINAL
1 series · 13 of 25 positions shown · non-contrast
Comparison: None

CLINICAL DATA: Uterine fibroids, RIGHT lower quadrant pain, pelvic
pain, menorrhagia with irregular cycle, trigger point of abdomen;
LMP 11/08/2020

EXAM:
TRANSABDOMINAL AND TRANSVAGINAL ULTRASOUND OF PELVIS
TECHNIQUE: Both transabdominal and transvaginal ultrasound examinations of the
pelvis were performed. Transabdominal technique was performed for
global imaging of the pelvis including uterus, ovaries, adnexal
regions, and pelvic cul-de-sac. It was necessary to proceed with
endovaginal exam following the transabdominal exam to visualize the
uterus, endometrium, and LEFT ovary, as well as to characterize a
cystic lesion in the RIGHT adnexa.

[Series 1: us pelvis complete with transvaginal · 0.24mm/px · 46 acquisitions, 13 frames shown]
[im 1/46]
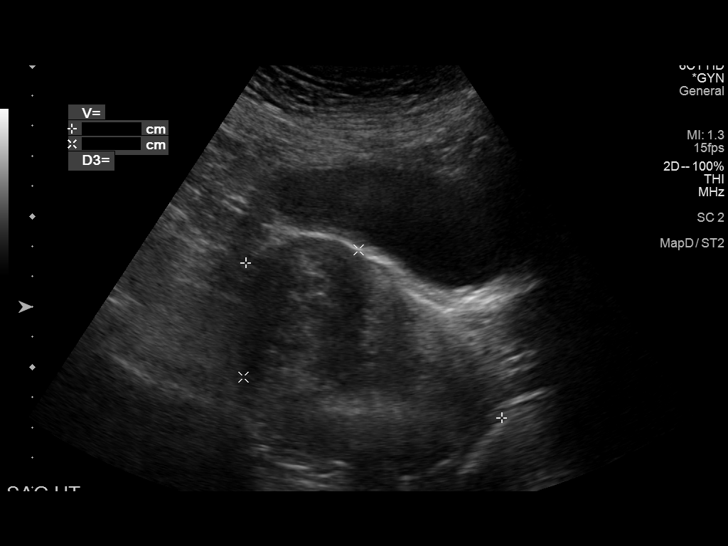
[im 4/46]
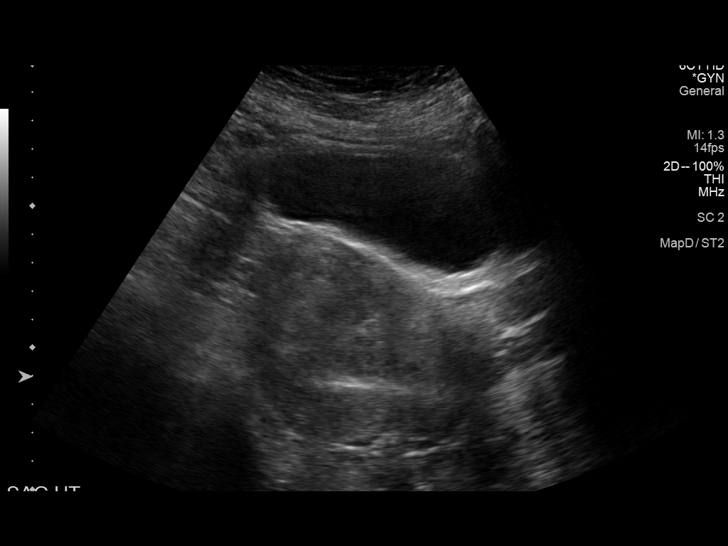
[im 8/46]
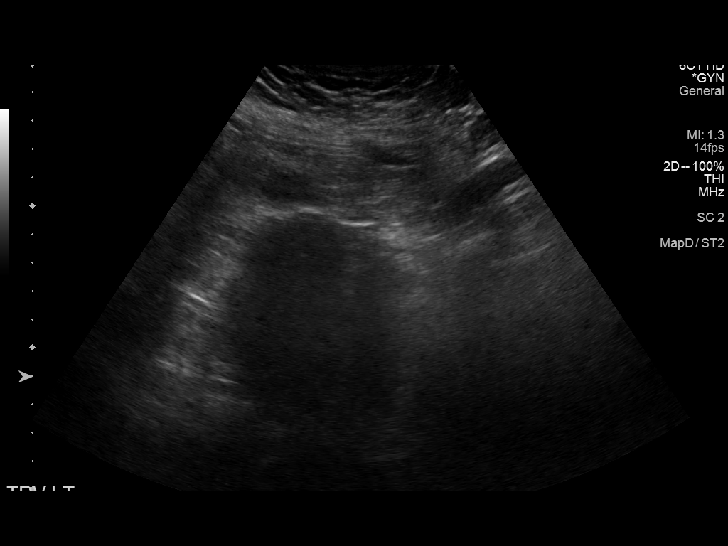
[im 12/46]
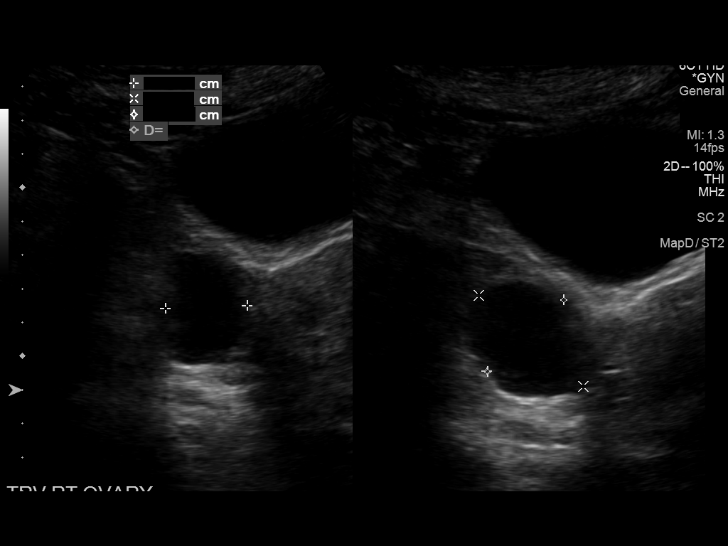
[im 16/46]
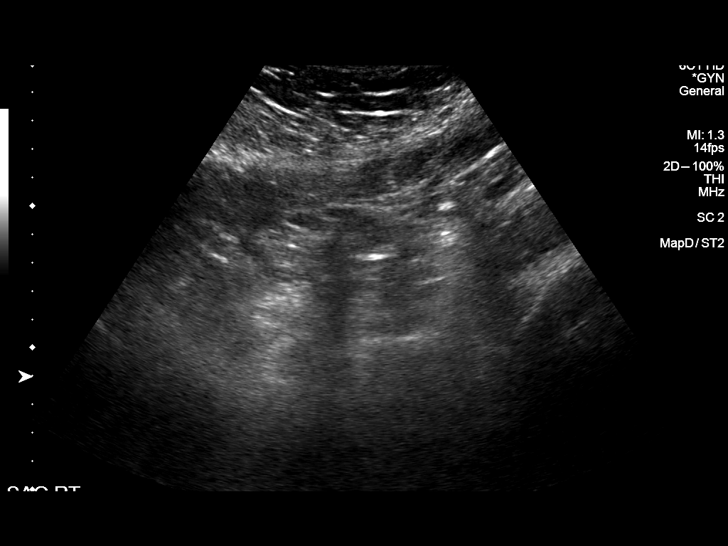
[im 19/46]
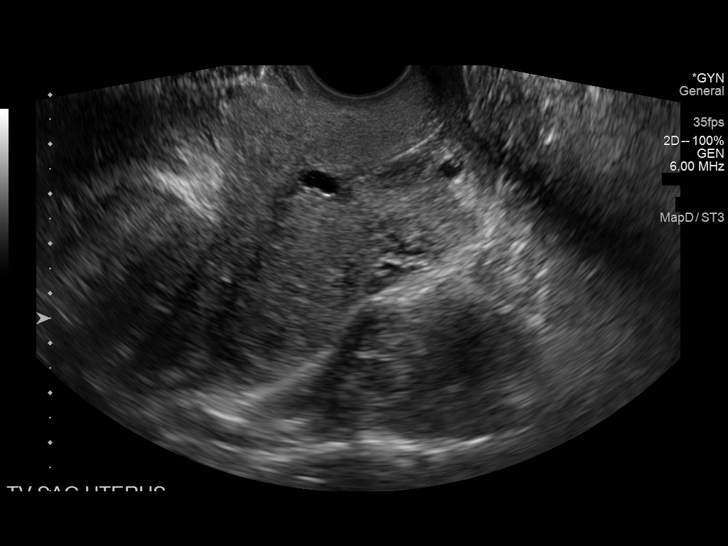
[im 23/46]
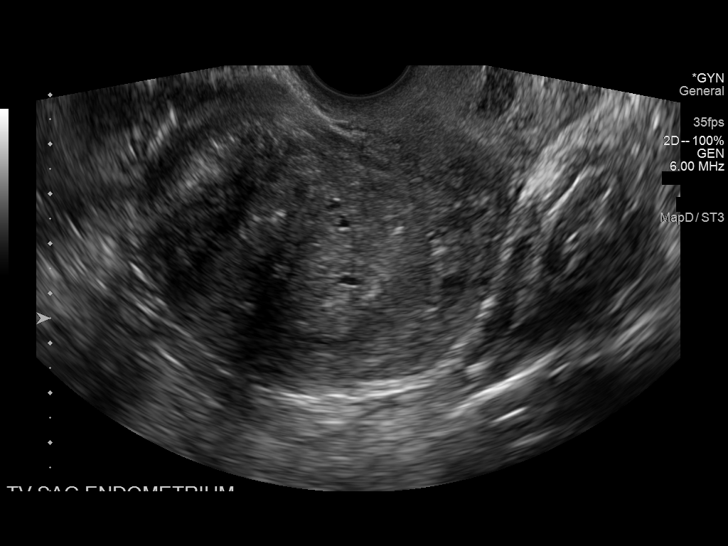
[im 27/46]
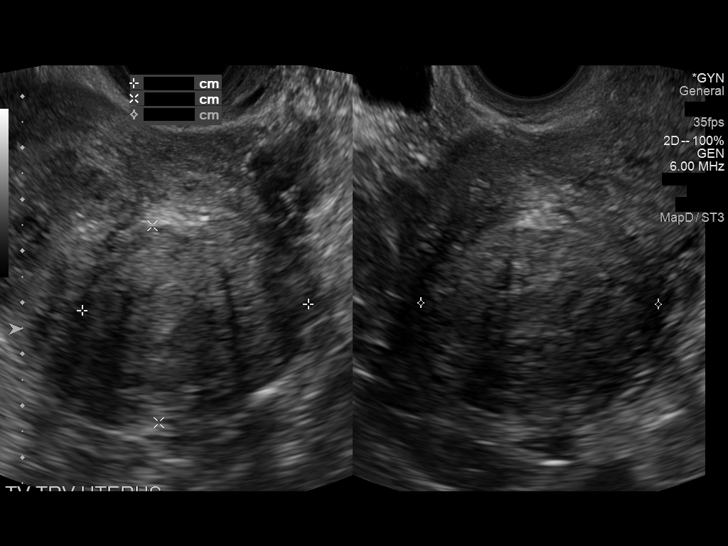
[im 31/46]
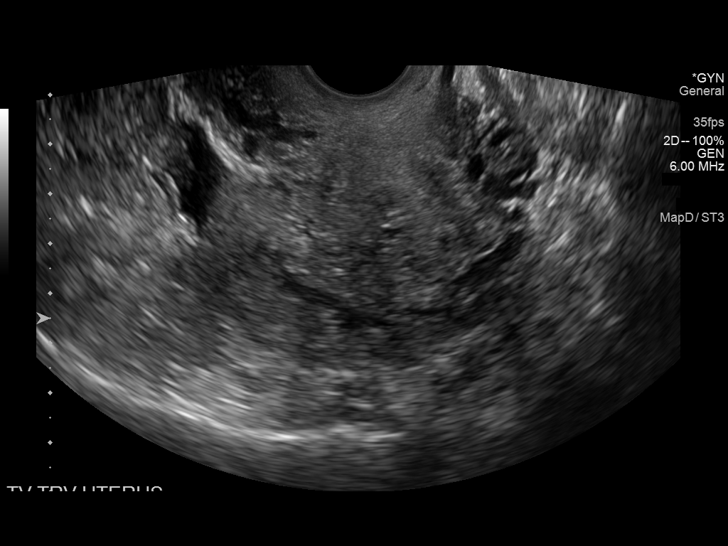
[im 34/46]
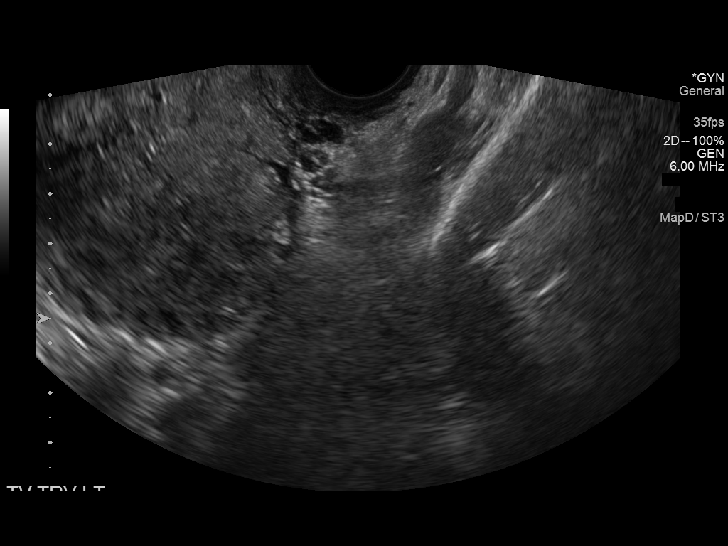
[im 38/46]
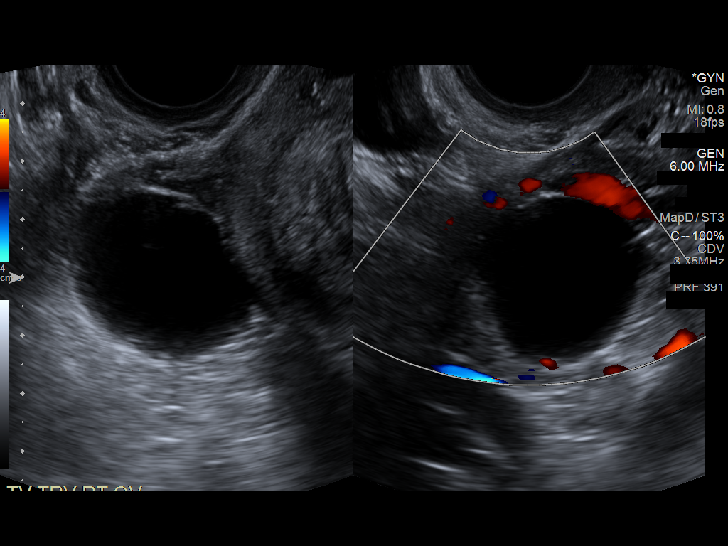
[im 42/46]
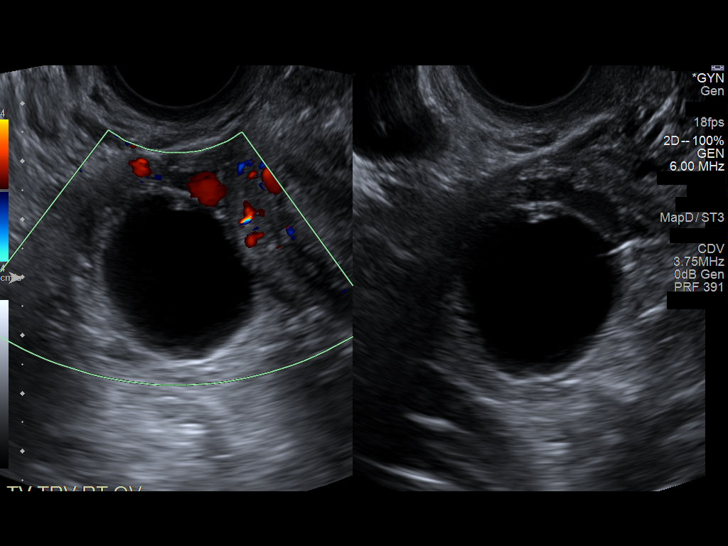
[im 46/46]
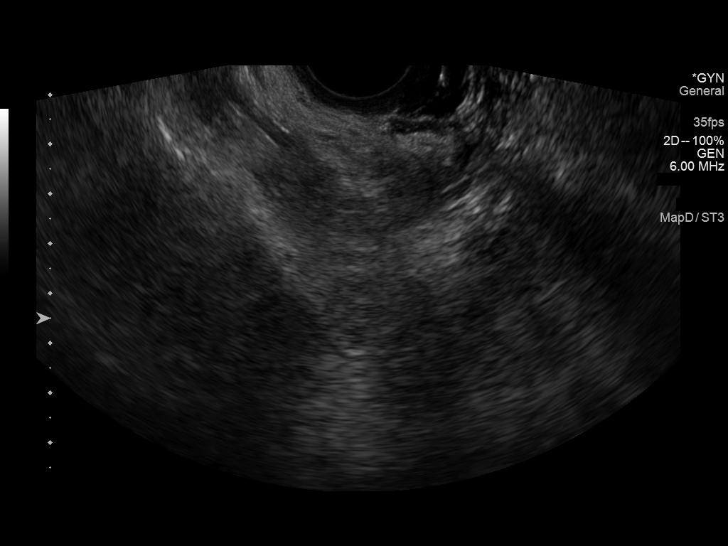

[13 of 25 positions shown; findings below may reference images not displayed]

FINDINGS: Uterus

Measurements: 10.0 x 5.7 x 6.3 cm = volume: 185 mL. Anteverted.
Heterogeneous myometrium. Nabothian cysts at cervix. Several masses
are seen within the uterus consistent with leiomyomata. Fundal
leiomyoma posteriorly, extending submucosal, 3.3 x 4.4 x 4.1 cm.
Anterior wall leiomyoma at fundus, submucosal, 4.4 x 3.8 x 4.6 cm.

Endometrium

Thickness: 7 mm.  No endometrial fluid or mass

Right ovary

Measurements: 3.0 x 3.8 x 3.3 cm = volume: 2.0 mL. Cyst within RIGHT
ovary 3.2 x 3.0 x 2.5 cm, containing a single focus of minimal wall
irregularity versus nodularity. Questionable tiny partial septation
on cine imaging.

Left ovary

Not visualized, likely obscured by bowel

Other findings

Trace free pelvic fluid.  No adnexal masses.
IMPRESSION: Two submucosal leiomyomata of the upper uterus.

Unremarkable endometrial complex.

Nonvisualization of LEFT ovary.

Minimally complicated cyst of the RIGHT ovary 3.2 cm greatest size;
this lesion is not a simple cyst and is indeterminate; short-term
follow-up ultrasound recommended in 6-12 weeks to reassess.
